# Patient Record
Sex: Female | Born: 1965 | Hispanic: No | Smoking: Never smoker
Health system: Southern US, Community
[De-identification: ages and names within clinical notes are randomized; demographics above are authoritative.]

## PROBLEM LIST (undated history)

## (undated) DIAGNOSIS — J302 Other seasonal allergic rhinitis: Secondary | ICD-10-CM

## (undated) DIAGNOSIS — Z30430 Encounter for insertion of intrauterine contraceptive device: Secondary | ICD-10-CM

## (undated) DIAGNOSIS — D649 Anemia, unspecified: Secondary | ICD-10-CM

## (undated) DIAGNOSIS — Z86010 Personal history of colonic polyps: Principal | ICD-10-CM

## (undated) DIAGNOSIS — F329 Major depressive disorder, single episode, unspecified: Secondary | ICD-10-CM

## (undated) DIAGNOSIS — E042 Nontoxic multinodular goiter: Secondary | ICD-10-CM

## (undated) DIAGNOSIS — T7840XA Allergy, unspecified, initial encounter: Secondary | ICD-10-CM

## (undated) DIAGNOSIS — E049 Nontoxic goiter, unspecified: Secondary | ICD-10-CM

## (undated) DIAGNOSIS — E781 Pure hyperglyceridemia: Secondary | ICD-10-CM

## (undated) HISTORY — DX: Nontoxic goiter, unspecified: E04.9

## (undated) HISTORY — DX: Nontoxic multinodular goiter: E04.2

## (undated) HISTORY — DX: Pure hyperglyceridemia: E78.1

## (undated) HISTORY — PX: WISDOM TOOTH EXTRACTION: SHX21

## (undated) HISTORY — DX: Personal history of colonic polyps: Z86.010

## (undated) HISTORY — DX: Encounter for insertion of intrauterine contraceptive device: Z30.430

## (undated) HISTORY — DX: Allergy, unspecified, initial encounter: T78.40XA

---

## 1986-01-27 HISTORY — PX: APPENDECTOMY: SHX54

## 1992-01-28 HISTORY — PX: COLPOSCOPY: SHX161

## 1997-12-07 ENCOUNTER — Other Ambulatory Visit: Admission: RE | Admit: 1997-12-07 | Discharge: 1997-12-07 | Payer: Self-pay | Admitting: *Deleted

## 1999-11-05 ENCOUNTER — Other Ambulatory Visit: Admission: RE | Admit: 1999-11-05 | Discharge: 1999-11-05 | Payer: Self-pay | Admitting: Obstetrics and Gynecology

## 2000-09-21 ENCOUNTER — Other Ambulatory Visit: Admission: RE | Admit: 2000-09-21 | Discharge: 2000-09-21 | Payer: Self-pay | Admitting: Obstetrics and Gynecology

## 2002-01-26 ENCOUNTER — Other Ambulatory Visit: Admission: RE | Admit: 2002-01-26 | Discharge: 2002-01-26 | Payer: Self-pay | Admitting: Obstetrics and Gynecology

## 2002-01-27 DIAGNOSIS — F32A Depression, unspecified: Secondary | ICD-10-CM

## 2002-01-27 HISTORY — DX: Depression, unspecified: F32.A

## 2003-02-17 ENCOUNTER — Other Ambulatory Visit: Admission: RE | Admit: 2003-02-17 | Discharge: 2003-02-17 | Payer: Self-pay | Admitting: Obstetrics and Gynecology

## 2004-03-08 ENCOUNTER — Other Ambulatory Visit: Admission: RE | Admit: 2004-03-08 | Discharge: 2004-03-08 | Payer: Self-pay | Admitting: Obstetrics and Gynecology

## 2005-03-11 ENCOUNTER — Other Ambulatory Visit: Admission: RE | Admit: 2005-03-11 | Discharge: 2005-03-11 | Payer: Self-pay | Admitting: Obstetrics and Gynecology

## 2006-05-01 ENCOUNTER — Other Ambulatory Visit: Admission: RE | Admit: 2006-05-01 | Discharge: 2006-05-01 | Payer: Self-pay | Admitting: Obstetrics and Gynecology

## 2007-07-14 ENCOUNTER — Other Ambulatory Visit: Admission: RE | Admit: 2007-07-14 | Discharge: 2007-07-14 | Payer: Self-pay | Admitting: Obstetrics and Gynecology

## 2008-08-04 DIAGNOSIS — Z30430 Encounter for insertion of intrauterine contraceptive device: Secondary | ICD-10-CM

## 2008-08-04 HISTORY — DX: Encounter for insertion of intrauterine contraceptive device: Z30.430

## 2009-08-08 ENCOUNTER — Encounter: Admission: RE | Admit: 2009-08-08 | Discharge: 2009-08-08 | Payer: Self-pay | Admitting: Obstetrics and Gynecology

## 2009-10-27 HISTORY — PX: OTHER SURGICAL HISTORY: SHX169

## 2009-10-31 ENCOUNTER — Other Ambulatory Visit: Admission: RE | Admit: 2009-10-31 | Discharge: 2009-10-31 | Payer: Self-pay | Admitting: Interventional Radiology

## 2009-10-31 ENCOUNTER — Encounter: Admission: RE | Admit: 2009-10-31 | Discharge: 2009-10-31 | Payer: Self-pay | Admitting: Endocrinology

## 2010-08-15 ENCOUNTER — Other Ambulatory Visit: Payer: Self-pay | Admitting: Endocrinology

## 2010-08-15 DIAGNOSIS — E049 Nontoxic goiter, unspecified: Secondary | ICD-10-CM

## 2010-08-26 ENCOUNTER — Ambulatory Visit
Admission: RE | Admit: 2010-08-26 | Discharge: 2010-08-26 | Disposition: A | Discharge status: Home / Self Care | Payer: BC Managed Care – PPO | Source: Ambulatory Visit | Attending: Endocrinology | Admitting: Endocrinology

## 2010-08-26 DIAGNOSIS — E049 Nontoxic goiter, unspecified: Secondary | ICD-10-CM

## 2010-09-06 ENCOUNTER — Encounter (INDEPENDENT_AMBULATORY_CARE_PROVIDER_SITE_OTHER): Payer: Self-pay | Admitting: General Surgery

## 2010-09-06 ENCOUNTER — Ambulatory Visit (INDEPENDENT_AMBULATORY_CARE_PROVIDER_SITE_OTHER): Payer: BC Managed Care – PPO | Admitting: General Surgery

## 2010-09-06 VITALS — BP 118/60 | HR 45 | Temp 97.6°F | Ht 67.0 in | Wt 151.0 lb

## 2010-09-06 DIAGNOSIS — N6019 Diffuse cystic mastopathy of unspecified breast: Secondary | ICD-10-CM

## 2010-09-06 NOTE — Patient Instructions (Signed)
PATIENT INSTRUCTIONS  FIBROCYSTIC BREAST DISEASE    FOLLOW-UP:  Please make an appointment with your physician in 6 month(s).  Call your physician should you develop a new breast mass that is different, if one particular lump starts to enlarge, or if nipple discharge develops.     CAUSE:  Many women have some lumpiness within their breasts and these areas at times can become tender during certain times in your menstrual cycle.  These areas tend to feel like a firm rubber ball as compared to a cancer which will more commonly feel hard and almost rock-like.  Fibrocystic breast disease does not in and of itself increase your risk for breast cancer but you should be sure to examine yiour breasts at the same time of the month on a monthly basis.  If there are a lot of areas of lumpiness you should tape a piece of paper on the mirror with a diagram of your breasts, noting where the areas of lumpiness are and their relative size.  You can then refer to this diagram on a monthly basis to keep better track of any changes should they occur.    DIET:  You should try and avoid foods, or at least minimize foods, such as chocolate and caffeine which may cause the symptoms of tenderness to become worse.    ACTIVITY:  You may want to wear a bra that offers additional support, and/or consider a sports bra, especially during those times when your breasts are more tender.    MEDICATIONS:  Taking Vitamin E capsules twice a day along with Evening of Primrose Capsules three times a day, or as directed on the bottle, may help your symptoms.  These are both available over-the-counter and without a prescription. There is clinical evidence that these may help symptoms in some patients.   If your physician has prescribed medication for your fibrocystic breast disease, be sure to take it as instructed on the bottle and let him/her know if you have any side effects.    QUESTIONS:  Please feel free to call your physician  if you have any  questions, and they will be glad to assist you.

## 2010-09-06 NOTE — Progress Notes (Signed)
Chelsea [name] is a 45 y.o. female.    Chief Complaint  Patient presents with  . Other    L breast lump    HPI HPI 45 year old Caucasian female referred by Dr. Meredeth Ide for a left breast palpable lesion. The patient noticed the area herself prior to May of this year. She mentioned it to her physician who monitored it for several months on physical exam. The patient believes that area has not significantly changed in size. It will occasionally enlarge around the time of her menstrual cycle. It only causes her some discomfort if she rotates in a certain way.  She denies any personal or family history of breast cancer. She denies any family history of colon, ovarian, pancreatic, prostate, or any other type of malignancy. Menarche was at age 63. She is a G4 P2. Age of first pregnancy was 45 years old. She has a Mirena IUD; however, she will still have menstrual cycles. She has never had a previous breast biopsy. She denies any breast skin changes or nipple discharge. She denies any lymphadenopathy or weight change.  She has been getting regular mammograms. She has had a diagnostic mammogram of her left breast and ultrasound most recently.  Past Medical History  Diagnosis Date  . Enlarged thyroid   . Multiple thyroid nodules     Past Surgical History  Procedure Date  . Colposcopy 1994  . Appendectomy     History reviewed. No pertinent family history.  Social History History  Substance Use Topics  . Smoking status: Never Smoker   . Smokeless tobacco: Not on file  . Alcohol Use: Yes    Allergies  Allergen Reactions  . Sulfur     Current Outpatient Prescriptions  Medication Sig Dispense Refill  . ibuprofen (ADVIL,MOTRIN) 200 MG tablet Take 200 mg by mouth every 6 (six) hours as needed.          Review of Systems Review of Systems  Constitutional: Negative for fever, chills, weight loss and diaphoresis.  HENT: Negative.   Eyes: Negative.   Respiratory: Negative.     Cardiovascular: Negative.   Gastrointestinal: Negative.   Genitourinary: Negative.   Musculoskeletal: Negative.   Neurological: Negative.  Negative for weakness.  Endo/Heme/Allergies: Negative.   Psychiatric/Behavioral: Negative.     Physical Exam Physical Exam  Constitutional: She is oriented to person, place, and time. She appears well-developed and well-nourished. No distress.  HENT:  Head: Normocephalic and atraumatic.  Eyes: Conjunctivae are normal. No scleral icterus.  Neck: Normal range of motion. Neck supple. No tracheal deviation present. No thyromegaly present.  Cardiovascular: Normal rate, regular rhythm and normal heart sounds.   Respiratory: Effort normal and breath sounds normal. No respiratory distress. She has no wheezes. Right breast exhibits no inverted nipple, no nipple discharge, no skin change and no tenderness. Left breast exhibits no inverted nipple, no nipple discharge, no skin change and no tenderness. Breasts are symmetrical.    GI: Soft. Bowel sounds are normal. There is no tenderness.  Musculoskeletal: Normal range of motion. She exhibits no edema.  Lymphadenopathy:    She has no cervical adenopathy.    She has no axillary adenopathy.  Neurological: She is alert and oriented to person, place, and time. She exhibits normal muscle tone.  Skin: Skin is warm and dry.  Psychiatric: She has a normal mood and affect. Her behavior is normal. Judgment and thought content normal.     Blood pressure 118/60, pulse 45, temperature 97.6 F (36.4 C),  height 5\' 7"  (1.702 m), weight 151 lb (68.493 kg).   Data reviewed: #1-August 13, 2010: Left breast ultrasound and diagnostic bilateral mammogram- the breasts are again noted to be involved to a moderate degree with heterogeneous fibroglandular structures. No abnormal masses identified and specifically in the medial left breast where the patient is questioning a lump and tenderness, no abnormality as noted. Left breast  ultrasound demonstrated primarily fatty breast tissue with a few interspersed glandular structures but nothing of significance. BI-RADS 2.  #2-March 13 2010: Right breast ultrasound-demonstrates no evidence of defined mass. There is a focal pattern of microcystic changes at 6:00 and 10:00. This pattern is stable when compared to the previous exam BI-RADS 2.  #3-November 15, 2009: I reviewed a right breast mammogram with ultrasound.  #4-August 06, 2009: Bilateral screening mammogram-scattered fibroglandular densities. The breast parenchymal pattern is stable with new new or worrisome finding in either breast.  I also reviewed the referring physicians medical notes.  Assessment/Plan 45 year old Caucasian female with a history of thyroid nodules with a left breast palpable lump consistent with fibrocystic change.  Her mammogram and ultrasound are reassuring. Her physical exam is also consistent with fibrocystic breast disease. We discussed fibrocystic breast disease. The patient was given educational material.  We discussed close observation versus surgical biopsy. I believe the area that the patient can palpate on physical exam is consistent with fibrocystic breast disease. My recommendation was to get a repeat ultrasound and unilateral mammogram in 6 months. I advised her to contact the office sooner should anything change on physical exam. I will see her in 6 months.  Chelsea Ina, MD  Chelsea [name] 09/06/2010, 8:34 PM

## 2011-03-10 ENCOUNTER — Encounter (INDEPENDENT_AMBULATORY_CARE_PROVIDER_SITE_OTHER): Payer: BC Managed Care – PPO | Admitting: General Surgery

## 2011-03-10 ENCOUNTER — Encounter (INDEPENDENT_AMBULATORY_CARE_PROVIDER_SITE_OTHER): Payer: Self-pay | Admitting: General Surgery

## 2011-03-10 ENCOUNTER — Ambulatory Visit (INDEPENDENT_AMBULATORY_CARE_PROVIDER_SITE_OTHER): Payer: BC Managed Care – PPO | Admitting: General Surgery

## 2011-03-10 VITALS — BP 118/76 | HR 68 | Temp 97.8°F | Resp 16 | Ht 67.0 in | Wt 160.2 lb

## 2011-03-10 DIAGNOSIS — N6019 Diffuse cystic mastopathy of unspecified breast: Secondary | ICD-10-CM

## 2011-03-10 NOTE — Patient Instructions (Signed)
Get your regular mammogram in July 2013

## 2011-03-10 NOTE — Progress Notes (Signed)
Subjective:     Patient ID: Chelsea [name], female   DOB: Nov 11, 1965, 46 y.o.   MRN: [id number]  HPI 45 year old Caucasian female comes in for followup for left breast palpable lump that she detected in July 2012. She had underwent an ultrasound of her left breast in July 2012 which was unremarkable. She comes in today for short followup. She denies any fever, chills, weight loss, nipple discharge, breast pain, skin changes, or lymphadenopathy. She denies any trips to the emergency room or hospital. She denies any new medical diagnoses or procedures. She denies any family diagnosis of cancer. She has not noticed the lump in her left breast for several months.  PMH, PSH, FAMH, SOCH, ALL, MEDs reviewed and unchanged.   Review of Systems 12 point ros NEGATIVE     Objective:   Physical Exam  BP 118/76  Pulse 68  Temp(Src) 97.8 F (36.6 C) (Temporal)  Resp 16  Ht 5\' 7"  (1.702 m)  Wt 160 lb 3.2 oz (72.666 kg)  BMI 25.09 kg/m2  Gen: alert, NAD, non-toxic appearing Pupils: equal, no scleral icterus Pulm: Lungs clear to auscultation, symmetric chest rise CV: regular rate and rhythm Abd: soft, nontender, nondistended.  Ext: no edema, MAE Skin: no rash, no jaundice Breast: symmetric, no nipple retraction, no skin changes. No palpable masses. No LAD (axillary or cervical)  Data Reviewed Right breast ultrasound 03/06/11 - Cluster of microcysts is redemonstrated at the 10:00 position and a smaller cluster of microcysts the 6:00 position. Stable-appearing compared to prior. There are no suspicious-looking masses, abnormal shadowing or other features suggestive for malignancy     Assessment:    46 year old Caucasian female with fibrocystic breast disease    Plan:     The patient was scheduled to have a left breast ultrasound to follow up on a palpable left breast lump. The ultrasound was ordered as her left breast ultrasound. However after conferring with the patient the patient confirms that  her right breast was ultrasounded at the most recent radiology appointment.  I spoke with Dr. Yolanda Bonine regarding this. She states that the patient will be contacted to schedule a left breast ultrasound for followup by her office.  Assuming her left breast ultrasound is normal, I will see her on an as-needed basis. If her ultrasound is abnormal then we will arrange followup.  Mary Sella. Andrey Campanile, MD, FACS General, Bariatric, & Minimally Invasive Surgery [facility], [place]

## 2011-03-11 ENCOUNTER — Telehealth (INDEPENDENT_AMBULATORY_CARE_PROVIDER_SITE_OTHER): Payer: Self-pay | Admitting: General Surgery

## 2011-03-11 NOTE — Telephone Encounter (Signed)
Patient called and left voicemail stating she spoke with Shanda Bumps @ [facility] re: this and did not need a call back.

## 2011-03-11 NOTE — Telephone Encounter (Signed)
Received a call from [place] at [facility] re: patient's left breast ultrasound. After reviewing records, it looks like patient had an issue with her left breast in the past but we were checking her right breast. The radiologist noted this and that is why a right breast ultrasound was performed. I tried to call patient to inform her of this, but phone went straight to voicemail. Left message for patient to call back.

## 2011-03-19 ENCOUNTER — Telehealth (INDEPENDENT_AMBULATORY_CARE_PROVIDER_SITE_OTHER): Payer: Self-pay | Admitting: General Surgery

## 2011-03-19 NOTE — Telephone Encounter (Signed)
Spoke with [facility], made them aware patient does need a left breast ultrasound per Dr Andrey Campanile due to area felt on physical exam by the patient and Dr Andrey Campanile. They will call patient and get this set up.

## 2011-03-27 ENCOUNTER — Encounter (INDEPENDENT_AMBULATORY_CARE_PROVIDER_SITE_OTHER): Payer: Self-pay

## 2011-03-28 ENCOUNTER — Encounter (INDEPENDENT_AMBULATORY_CARE_PROVIDER_SITE_OTHER): Payer: Self-pay

## 2011-05-22 ENCOUNTER — Encounter (INDEPENDENT_AMBULATORY_CARE_PROVIDER_SITE_OTHER): Payer: Self-pay

## 2011-05-29 ENCOUNTER — Other Ambulatory Visit: Payer: Self-pay | Admitting: Dermatology

## 2011-10-02 ENCOUNTER — Encounter (INDEPENDENT_AMBULATORY_CARE_PROVIDER_SITE_OTHER): Payer: Self-pay

## 2011-10-09 ENCOUNTER — Encounter (INDEPENDENT_AMBULATORY_CARE_PROVIDER_SITE_OTHER): Payer: Self-pay | Admitting: General Surgery

## 2011-10-09 ENCOUNTER — Ambulatory Visit (INDEPENDENT_AMBULATORY_CARE_PROVIDER_SITE_OTHER): Payer: BC Managed Care – PPO | Admitting: General Surgery

## 2011-10-09 VITALS — BP 120/82 | HR 62 | Temp 97.8°F | Ht 67.0 in | Wt 159.0 lb

## 2011-10-09 DIAGNOSIS — N6009 Solitary cyst of unspecified breast: Secondary | ICD-10-CM

## 2011-10-09 NOTE — Patient Instructions (Signed)
We will get a followup ultrasound to evaluate your right breast cysts

## 2011-10-10 NOTE — Progress Notes (Signed)
Subjective:     Patient ID: Chelsea [name], female   DOB: Feb 04, 1965, 46 y.o.   MRN: [id number]  HPI 46 year old Caucasian female comes in for followup for left breast palpable lump that she detected in July 2012. She had underwent an ultrasound of her left breast in July 2012 which was unremarkable. She comes in today for followup. I last saw her in the office in February 2013. At that time she was supposed to have a followup left breast ultrasound; however, the right breast was ultrasounded by error. She ultimately underwent left breast ultrasound in February 2013 which was normal. However the right breast ultrasound did reveal some microcyst at the 10:00 as well the 6:00 position. She denies any fever, chills, weight loss, nipple discharge, breast pain, skin changes, or lymphadenopathy. She denies any trips to the emergency room or hospital. She denies any new medical diagnoses or procedures. She denies any family diagnosis of cancer. She has not noticed the lump in her left breast for several months. She underwent her routine bilateral screening mammogram on 08/20/2011 which was normal  PMH, PSH, FAMH, SOCH, ALL, MEDs reviewed and unchanged.   Review of Systems 12 point ros NEGATIVE     Objective:   Physical Exam  BP 120/82  Pulse 62  Temp 97.8 F (36.6 C) (Temporal)  Ht 5\' 7"  (1.702 m)  Wt 159 lb (72.122 kg)  BMI 24.90 kg/m2  SpO2 98%  Gen: alert, NAD, non-toxic appearing Pupils: equal, no scleral icterus Pulm: Lungs clear to auscultation, symmetric chest rise CV: regular rate and rhythm Abd: soft, nontender, nondistended.  Ext: no edema, MAE Skin: no rash, no jaundice Breast: symmetric, no nipple retraction, no skin changes. No palpable masses. No LAD (axillary or cervical)  Data Reviewed B/l screening mammogram - Birads 1; heterogeneously dense; no significant change  Right breast ultrasound 03/06/11 - Cluster of microcysts is redemonstrated at the 10:00 position and a smaller  cluster of microcysts the 6:00 position. Stable-appearing compared to prior. There are no suspicious-looking masses, abnormal shadowing or other features suggestive for malignancy  Previous breast u/s     Assessment:    46 year old Caucasian female with fibrocystic breast disease    Plan:     We reviewed her prior ultrasounds as well as her mammogram. There is no suspicious findings on physical exam. However we did discuss the right breast ultrasound which revealed microcysts at the 10:00 as well as at the 6:00 position.  I spoke with Dr. Yolanda Bonine regarding this. We both agreed the patient should probably undergo a followup right breast ultrasound to evaluate the right breast cysts seen in February 2013.  Assuming her right breast ultrasound is normal-stable, I will see her on an as-needed basis.   Mary Sella. Andrey Campanile, MD, FACS General, Bariatric, & Minimally Invasive Surgery [facility], [place]

## 2011-11-06 ENCOUNTER — Encounter (INDEPENDENT_AMBULATORY_CARE_PROVIDER_SITE_OTHER): Payer: Self-pay

## 2012-04-07 ENCOUNTER — Other Ambulatory Visit: Payer: Self-pay | Admitting: Endocrinology

## 2012-04-07 DIAGNOSIS — E041 Nontoxic single thyroid nodule: Secondary | ICD-10-CM

## 2012-05-03 ENCOUNTER — Encounter (INDEPENDENT_AMBULATORY_CARE_PROVIDER_SITE_OTHER): Payer: Self-pay

## 2012-07-26 ENCOUNTER — Other Ambulatory Visit: Payer: BC Managed Care – PPO

## 2012-07-28 ENCOUNTER — Ambulatory Visit
Admission: RE | Admit: 2012-07-28 | Discharge: 2012-07-28 | Disposition: A | Discharge status: Home / Self Care | Payer: BC Managed Care – PPO | Source: Ambulatory Visit | Attending: Endocrinology | Admitting: Endocrinology

## 2012-07-28 DIAGNOSIS — E041 Nontoxic single thyroid nodule: Secondary | ICD-10-CM

## 2012-08-03 ENCOUNTER — Other Ambulatory Visit: Payer: Self-pay | Admitting: Endocrinology

## 2012-08-03 DIAGNOSIS — E041 Nontoxic single thyroid nodule: Secondary | ICD-10-CM

## 2012-08-17 ENCOUNTER — Ambulatory Visit
Admission: RE | Admit: 2012-08-17 | Discharge: 2012-08-17 | Disposition: A | Discharge status: Home / Self Care | Payer: BC Managed Care – PPO | Source: Ambulatory Visit | Attending: Endocrinology | Admitting: Endocrinology

## 2012-08-17 ENCOUNTER — Other Ambulatory Visit (HOSPITAL_COMMUNITY)
Admission: RE | Admit: 2012-08-17 | Discharge: 2012-08-17 | Disposition: A | Discharge status: Home / Self Care | Payer: BC Managed Care – PPO | Source: Ambulatory Visit | Attending: Interventional Radiology | Admitting: Interventional Radiology

## 2012-08-17 DIAGNOSIS — E041 Nontoxic single thyroid nodule: Secondary | ICD-10-CM

## 2012-08-17 DIAGNOSIS — E049 Nontoxic goiter, unspecified: Secondary | ICD-10-CM | POA: Insufficient documentation

## 2012-08-26 ENCOUNTER — Encounter: Payer: Self-pay | Admitting: Obstetrics and Gynecology

## 2012-08-27 ENCOUNTER — Encounter: Payer: Self-pay | Admitting: Obstetrics and Gynecology

## 2012-08-27 ENCOUNTER — Ambulatory Visit (INDEPENDENT_AMBULATORY_CARE_PROVIDER_SITE_OTHER): Payer: BC Managed Care – PPO | Admitting: Obstetrics and Gynecology

## 2012-08-27 VITALS — Ht 67.25 in | Wt 172.0 lb

## 2012-08-27 DIAGNOSIS — T8332XA Displacement of intrauterine contraceptive device, initial encounter: Secondary | ICD-10-CM

## 2012-08-27 DIAGNOSIS — Z01419 Encounter for gynecological examination (general) (routine) without abnormal findings: Secondary | ICD-10-CM

## 2012-08-27 DIAGNOSIS — Z0000 Encounter for general adult medical examination without abnormal findings: Secondary | ICD-10-CM

## 2012-08-27 DIAGNOSIS — T8339XA Other mechanical complication of intrauterine contraceptive device, initial encounter: Secondary | ICD-10-CM

## 2012-08-27 LAB — POCT URINALYSIS DIPSTICK: pH, UA: 5

## 2012-08-27 LAB — HEMOGLOBIN, FINGERSTICK: Hemoglobin, fingerstick: 13.8 g/dL (ref 12.0–16.0)

## 2012-08-27 NOTE — Progress Notes (Signed)
47 y.o.   Divorced    Caucasian   female   [id number]   here for annual exam.  Had bx of thyroid nodule by Dr. Horald Pollen and was benign.  Bx done because nodule was growing. Has only occ and light menses with her Mirena  No LMP recorded. Patient is not currently having periods (Reason: IUD).          Sexually active: no  The current method of family planning is IUD.    Exercising: walking qd Last mammogram:  08/20/11 ; 04/20/12 (ultrasound) Last pap smear: 08/06/09 History of abnormal pap: yes, ASCUS 1994 Smoking:  no Alcohol: 3-4 drinks/wk Last colonoscopy: no Last Bone Density:  nonr Last tetanus shot: 2008  Last cholesterol check: not sure   Hgb: 13.8            Urine: Negative   Family History  Problem Relation Age of Onset  . Hypertension Mother   . Hypertension Father     There are no active problems to display for this patient.   Past Medical History  Diagnosis Date  . Enlarged thyroid   . Multiple thyroid nodules   . Encounter for insertion of mirena IUD 08/04/08    Past Surgical History  Procedure Laterality Date  . Thyroid biopsy  10/11    hyplerplastic thyroid nodule- multinodular goiter  . Appendectomy  1988  . Colposcopy  1994    ASCUS    Allergies: Sulfur and Mupirocin  Current Outpatient Prescriptions  Medication Sig Dispense Refill  . DiphenhydrAMINE HCl (ALLERGY MED PO) Take by mouth.      Marland Kitchen ibuprofen (ADVIL,MOTRIN) 200 MG tablet Take 200 mg by mouth every 6 (six) hours as needed.        Marland Kitchen levonorgestrel (MIRENA) 20 MCG/24HR IUD 1 each by Intrauterine route once.       No current facility-administered medications for this visit.    ROS: Pertinent items are noted in HPI.  Social Hx:  Divorced, two children  Exam:    Ht 5' 7.25" (1.708 m)  Wt 172 lb (78.019 kg)  BMI 26.74 kg/m2  Ht stable, wt up 14 pounds Wt Readings from Last 3 Encounters:  08/27/12 172 lb (78.019 kg)  10/09/11 159 lb (72.122 kg)  03/10/11 160 lb 3.2 oz (72.666 kg)     Ht  Readings from Last 3 Encounters:  08/27/12 5' 7.25" (1.708 m)  10/09/11 5\' 7"  (1.702 m)  03/10/11 5\' 7"  (1.702 m)    General appearance: alert, cooperative and appears stated age Head: Normocephalic, without obvious abnormality, atraumatic Neck: no adenopathy, supple, symmetrical, trachea midline and thyroid not enlarged, symmetric, no tenderness/mass/nodules Lungs: clear to auscultation bilaterally Breasts: Inspection negative, No nipple retraction or dimpling, No nipple discharge or bleeding, No axillary or supraclavicular adenopathy, Normal to palpation without dominant masses Heart: regular rate and rhythm Abdomen: soft, non-tender; bowel sounds normal; no masses,  no organomegaly Extremities: extremities normal, atraumatic, no cyanosis or edema Skin: Skin color, texture, turgor normal. No rashes or lesions Lymph nodes: Cervical, supraclavicular, and axillary nodes normal. No abnormal inguinal nodes palpated Neurologic: Grossly normal   Pelvic: External genitalia:  no lesions              Urethra:  normal appearing urethra with no masses, tenderness or lesions              Bartholins and Skenes: normal  Vagina: normal appearing vagina with normal color and discharge, no lesions              Cervix: normal appearance but IUD strings not viz or palpable.  Tried to probe the canal with an IUD hook and with a cytobrush and no strings became visible.  Pt does not feel for them herself, so we have no way of knowing how long the strings have been missing.                Pap taken: yes        Bimanual Exam:  Uterus:  uterus is normal size, shape, consistency and nontender, RF, not especially mobile                                      Adnexa: normal adnexa in size, nontender and no masses                                      Rectovaginal: Confirms                                      Anus:  normal sphincter tone, no lesions  A: normal gyn exam, Mirena     miltinodular  thyroid with recent benign biopsy     Mirena placed July 2010 - strings not visible currently      P:     mammogram pap smear counseled on breast self exam, mammography screening, adequate intake of calcium and vitamin D, diet and exercise return annually or prn   Check FLP. Check PUS for IUD localization.     An After Visit Summary was printed and given to the patient.

## 2012-08-27 NOTE — Patient Instructions (Signed)
We will call you at the end of next week to schedule your ultrasound.  Your cholesterol result will be ready in about a week.   We will call you with the result.  If you will sign up for "my chart" our patient portal, the results will be available there.  EXERCISE AND DIET:  We recommended that you start or continue a regular exercise program for good health. Regular exercise means any activity that makes your heart beat faster and makes you sweat.  We recommend exercising at least 30 minutes per day at least 3 days a week, preferably 4 or 5.  We also recommend a diet low in fat and sugar.  Inactivity, poor dietary choices and obesity can cause diabetes, heart attack, stroke, and kidney damage, among others.    ALCOHOL AND SMOKING:  Women should limit their alcohol intake to no more than 7 drinks/beers/glasses of wine (combined, not each!) per week. Moderation of alcohol intake to this level decreases your risk of breast cancer and liver damage. And of course, no recreational drugs are part of a healthy lifestyle.  And absolutely no smoking or even second hand smoke. Most people know smoking can cause heart and lung diseases, but did you know it also contributes to weakening of your bones? Aging of your skin?  Yellowing of your teeth and nails?  CALCIUM AND VITAMIN D:  Adequate intake of calcium and Vitamin D are recommended.  The recommendations for exact amounts of these supplements seem to change often, but generally speaking 600 mg of calcium (either carbonate or citrate) and 800 units of Vitamin D per day seems prudent. Certain women may benefit from higher intake of Vitamin D.  If you are among these women, your doctor will have told you during your visit.    PAP SMEARS:  Pap smears, to check for cervical cancer or precancers,  have traditionally been done yearly, although recent scientific advances have shown that most women can have pap smears less often.  However, every woman still should have a  physical exam from her gynecologist every year. It will include a breast check, inspection of the vulva and vagina to check for abnormal growths or skin changes, a visual exam of the cervix, and then an exam to evaluate the size and shape of the uterus and ovaries.  And after 47 years of age, a rectal exam is indicated to check for rectal cancers. We will also provide age appropriate advice regarding health maintenance, like when you should have certain vaccines, screening for sexually transmitted diseases, bone density testing, colonoscopy, mammograms, etc.   MAMMOGRAMS:  All women over 16 years old should have a yearly mammogram. Many facilities now offer a "3D" mammogram, which may cost around $50 extra out of pocket. If possible,  we recommend you accept the option to have the 3D mammogram performed.  It both reduces the number of women who will be called back for extra views which then turn out to be normal, and it is better than the routine mammogram at detecting truly abnormal areas.    COLONOSCOPY:  Colonoscopy to screen for colon cancer is recommended for all women at age 67.  We know, you hate the idea of the prep.  We agree, BUT, having colon cancer and not knowing it is worse!!  Colon cancer so often starts as a polyp that can be seen and removed at colonscopy, which can quite literally save your life!  And if your first colonoscopy is  normal and you have no family history of colon cancer, most women don't have to have it again for 10 years.  Once every ten years, you can do something that may end up saving your life, right?  We will be happy to help you get it scheduled when you are ready.  Be sure to check your insurance coverage so you understand how much it will cost.  It may be covered as a preventative service at no cost, but you should check your particular policy.

## 2012-08-28 LAB — LIPID PANEL
HDL: 54 mg/dL (ref >39)
LDL Cholesterol: 81 mg/dL (ref 0–99)
Total CHOL/HDL Ratio: 3.2 Ratio
VLDL: 39 mg/dL (ref 0–40)

## 2012-08-30 NOTE — Addendum Note (Signed)
Addended by: Alison Murray on: 08/30/2012 02:56 PM   Modules accepted: Orders

## 2012-08-31 LAB — IPS PAP TEST WITH HPV

## 2012-09-01 ENCOUNTER — Telehealth: Payer: Self-pay | Admitting: Obstetrics and Gynecology

## 2012-09-01 NOTE — Telephone Encounter (Signed)
Patient is returning Carol's call for lab results. Okay to leave detailed message.

## 2012-09-01 NOTE — Telephone Encounter (Signed)
Spoke with patient about her lab results cm

## 2012-09-21 ENCOUNTER — Ambulatory Visit (INDEPENDENT_AMBULATORY_CARE_PROVIDER_SITE_OTHER): Payer: BC Managed Care – PPO

## 2012-09-21 ENCOUNTER — Encounter: Payer: Self-pay | Admitting: Obstetrics and Gynecology

## 2012-09-21 ENCOUNTER — Ambulatory Visit (INDEPENDENT_AMBULATORY_CARE_PROVIDER_SITE_OTHER): Payer: BC Managed Care – PPO | Admitting: Obstetrics and Gynecology

## 2012-09-21 DIAGNOSIS — T8332XA Displacement of intrauterine contraceptive device, initial encounter: Secondary | ICD-10-CM

## 2012-09-21 DIAGNOSIS — T8339XA Other mechanical complication of intrauterine contraceptive device, initial encounter: Secondary | ICD-10-CM

## 2012-09-21 NOTE — Patient Instructions (Signed)
Continue with you routine care.  The IUD is in good position.

## 2012-09-21 NOTE — Progress Notes (Signed)
47 yo DWF G3P2 with Mirena, strings unable to be viz'd at [facility].  Here for IUD localization.  Pus shows:    Discussed results with pt.  IUD in correct position in the uterus.  One 3 cm fibroid, asymptomatic.  Pt advised no intervention necessary at this point.  Continue routine f/u and IUD removal in July 2015.

## 2012-12-09 ENCOUNTER — Encounter (INDEPENDENT_AMBULATORY_CARE_PROVIDER_SITE_OTHER): Payer: Self-pay

## 2013-01-03 ENCOUNTER — Encounter (INDEPENDENT_AMBULATORY_CARE_PROVIDER_SITE_OTHER): Payer: Self-pay

## 2013-01-05 ENCOUNTER — Encounter: Payer: Self-pay | Admitting: Obstetrics & Gynecology

## 2013-08-29 ENCOUNTER — Ambulatory Visit: Payer: BC Managed Care – PPO | Admitting: Obstetrics and Gynecology

## 2013-08-29 ENCOUNTER — Encounter: Payer: Self-pay | Admitting: Obstetrics and Gynecology

## 2013-08-29 ENCOUNTER — Ambulatory Visit (INDEPENDENT_AMBULATORY_CARE_PROVIDER_SITE_OTHER): Payer: BC Managed Care – PPO | Admitting: Obstetrics and Gynecology

## 2013-08-29 VITALS — BP 110/68 | HR 70 | Resp 14 | Ht 67.0 in | Wt 172.4 lb

## 2013-08-29 DIAGNOSIS — Z3009 Encounter for other general counseling and advice on contraception: Secondary | ICD-10-CM

## 2013-08-29 DIAGNOSIS — Z0000 Encounter for general adult medical examination without abnormal findings: Secondary | ICD-10-CM

## 2013-08-29 DIAGNOSIS — Z01419 Encounter for gynecological examination (general) (routine) without abnormal findings: Secondary | ICD-10-CM

## 2013-08-29 DIAGNOSIS — R0789 Other chest pain: Secondary | ICD-10-CM

## 2013-08-29 LAB — POCT URINALYSIS DIPSTICK
BILIRUBIN UA: NEGATIVE
Blood, UA: NEGATIVE
Glucose, UA: NEGATIVE
KETONES UA: NEGATIVE
LEUKOCYTES UA: NEGATIVE
Nitrite, UA: NEGATIVE
Protein, UA: NEGATIVE
Urobilinogen, UA: NEGATIVE
pH, UA: 5

## 2013-08-29 LAB — COMPREHENSIVE METABOLIC PANEL
ALT: 33 U/L (ref 0–35)
AST: 30 U/L (ref 0–37)
Albumin: 4.3 g/dL (ref 3.5–5.2)
Alkaline Phosphatase: 41 U/L (ref 39–117)
BILIRUBIN TOTAL: 0.5 mg/dL (ref 0.2–1.2)
BUN: 12 mg/dL (ref 6–23)
CO2: 25 meq/L (ref 19–32)
CREATININE: 0.83 mg/dL (ref 0.50–1.10)
Calcium: 9.1 mg/dL (ref 8.4–10.5)
Chloride: 99 mEq/L (ref 96–112)
GLUCOSE: 81 mg/dL (ref 70–99)
Potassium: 4.8 mEq/L (ref 3.5–5.3)
SODIUM: 135 meq/L (ref 135–145)
TOTAL PROTEIN: 6.8 g/dL (ref 6.0–8.3)

## 2013-08-29 LAB — LIPID PANEL
CHOLESTEROL: 180 mg/dL (ref 0–200)
HDL: 55 mg/dL (ref >39)
LDL Cholesterol: 94 mg/dL (ref 0–99)
TRIGLYCERIDES: 155 mg/dL — AB (ref <150)
Total CHOL/HDL Ratio: 3.3 Ratio
VLDL: 31 mg/dL (ref 0–40)

## 2013-08-29 LAB — CBC
HCT: 40.2 % (ref 36.0–46.0)
HEMOGLOBIN: 14 g/dL (ref 12.0–15.0)
MCH: 31.7 pg (ref 26.0–34.0)
MCHC: 34.8 g/dL (ref 30.0–36.0)
MCV: 91.2 fL (ref 78.0–100.0)
PLATELETS: 328 10*3/uL (ref 150–400)
RBC: 4.41 MIL/uL (ref 3.87–5.11)
RDW: 12.3 % (ref 11.5–15.5)
WBC: 6.2 10*3/uL (ref 4.0–10.5)

## 2013-08-29 NOTE — Progress Notes (Signed)
Patient ID: Chelsea [name], female   DOB: 1965-03-20, 48 y.o.   MRN: [id number] GYNECOLOGY VISIT  PCP:   Kathyrn Lass, MD Endocrinology:  Dr. Soyla Murphy Referring provider:   HPI: 48 y.o.   Divorced  Caucasian  female   [phone] with No LMP recorded. Patient is not currently having periods (Reason: IUD).   here for   AEX. Mirena IUD placed 08/04/08. Likes the IUD.  Was having heavy bleeding prior to this IUD being placed.  Had an ultrasound last year to localize the IUD in endometrial canal.   Feeling some weight in her chest since June 2015.  No cough.  No chest pain. No radiation of anything down left arm or into jaw.  Has allergies.  Stress due to daughter's wedding in June.  Recent travel to [place].   [place] and hurt her back in August 2015.  Had an MRI and this was normal.   Hgb:    13.9 Urine:  neg  GYNECOLOGIC HISTORY: No LMP recorded. Patient is not currently having periods (Reason: IUD). Sexually active:  yes Partner preference: female Contraception:  Mirena IUD--inserted 07/2008, vasectomy.  Menopausal hormone therapy: n/a DES exposure:  no  Blood transfusions:  no  Sexually transmitted diseases:  no GYN procedures and prior surgeries:  Colposcopy and cryotherapy to cervix 1994. Last mammogram:  11-30-12 VEL:FYBOF.  Patient did follow up of right breast and has benign cysts.  Due for routine follow up in November 2015.            Last pap and high risk HPV testing:  08-30-12 wnl:neg HR HPV  History of abnormal pap smear:  Ascus in 1994 with colposcopy and cryotherapy to cervix.   OB History   Grav Para Term Preterm Abortions TAB SAB Ect Mult Living   3 2 2  1     2        LIFESTYLE: Exercise: walking          Tobacco:  no Alcohol:    no Drug use:  no  OTHER HEALTH MAINTENANCE: Tetanus/TDap:  2008 Gardisil:             n/a Influenza:          10/2012 Zostavax:         n/a  Bone density:   n/a Colonoscopy:    n/a  Cholesterol check: normal 2014  Family  History  Problem Relation Age of Onset  . Hypertension Mother   . Hypertension Father     There are no active problems to display for this patient.  Past Medical History  Diagnosis Date  . Enlarged thyroid   . Multiple thyroid nodules   . Encounter for insertion of mirena IUD 08/04/08    Past Surgical History  Procedure Laterality Date  . Thyroid biopsy  10/11    hyplerplastic thyroid nodule- multinodular goiter  . Appendectomy  1988  . Colposcopy  1994    ASCUS    ALLERGIES: Sulfur and Mupirocin  Current Outpatient Prescriptions  Medication Sig Dispense Refill  . levonorgestrel (MIRENA) 20 MCG/24HR IUD 1 each by Intrauterine route once.      . DiphenhydrAMINE HCl (ALLERGY MED PO) Take by mouth.      Marland Kitchen ibuprofen (ADVIL,MOTRIN) 200 MG tablet Take 200 mg by mouth every 6 (six) hours as needed.         No current facility-administered medications for this visit.     ROS:  Pertinent items are noted in HPI.  SOCIAL HISTORY:  Married.   PHYSICAL EXAMINATION:    BP 110/68  Pulse 70  Resp 14  Ht 5\' 7"  (1.702 m)  Wt 172 lb 6.4 oz (78.2 kg)  BMI 27.00 kg/m2   Wt Readings from Last 3 Encounters:  08/29/13 172 lb 6.4 oz (78.2 kg)  08/27/12 172 lb (78.019 kg)  10/09/11 159 lb (72.122 kg)     Ht Readings from Last 3 Encounters:  08/29/13 5\' 7"  (1.702 m)  08/27/12 5' 7.25" (1.708 m)  10/09/11 5\' 7"  (1.702 m)    General appearance: alert, cooperative and appears stated age.  No acute distress.  Head: Normocephalic, without obvious abnormality, atraumatic Neck: no adenopathy, supple, symmetrical, trachea midline and thyroid not enlarged, symmetric, no tenderness/mass/nodules Lungs: clear to auscultation bilaterally Breasts: Inspection negative, No nipple retraction or dimpling, No nipple discharge or bleeding, No axillary or supraclavicular adenopathy, Normal to palpation without dominant masses Heart: regular rate and rhythm Abdomen: soft, non-tender; no masses,  no  organomegaly Extremities: extremities normal, atraumatic, no cyanosis or edema Skin: Skin color, texture, turgor normal. No rashes or lesions Lymph nodes: Cervical, supraclavicular, and axillary nodes normal. No abnormal inguinal nodes palpated Neurologic: Grossly normal  Pelvic: External genitalia:  no lesions              Urethra:  normal appearing urethra with no masses, tenderness or lesions              Bartholins and Skenes: normal                 Vagina: normal appearing vagina with normal color and discharge, no lesions              Cervix: normal appearance.  IUD strings not visible.               Pap and high risk HPV testing done: No.         Bimanual Exam:  Uterus:  uterus is normal size, shape, consistency and nontender                                      Adnexa: normal adnexa in size, nontender and no masses                                      Rectovaginal: Confirms                                      Anus:  normal sphincter tone, no lesions  ASSESSMENT  Normal gynecologic exam. Expired IUD.  History of cryotherapy to cervix.  Chest heaviness.    PLAN  Mammogram recommended yearly.  Pap smear and high risk HPV testing not indicated.  Use back up protection.  (Partner has vasectomy. ) Plan for IUD removal and insertion of new Mirena IUD.  Counseled on self breast exam, Calcium and vitamin D intake, exercise. Lipid profile, CMP, CBC. I recommend that patient follow up with her PCP regarding her chest symptoms.  Return annually or prn   An After Visit Summary was printed and given to the patient.

## 2013-08-29 NOTE — Patient Instructions (Signed)

## 2013-08-30 DIAGNOSIS — R0789 Other chest pain: Secondary | ICD-10-CM | POA: Insufficient documentation

## 2013-08-30 LAB — HEMOGLOBIN, FINGERSTICK: HEMOGLOBIN, FINGERSTICK: 13.9 g/dL (ref 12.0–16.0)

## 2013-09-12 ENCOUNTER — Telehealth: Payer: Self-pay | Admitting: Obstetrics and Gynecology

## 2013-09-12 DIAGNOSIS — Z0189 Encounter for other specified special examinations: Secondary | ICD-10-CM

## 2013-09-12 NOTE — Telephone Encounter (Signed)
Patient called back. Spoke with patient. Advised that per benefits quote received, IUD, removal, and insertion is covered at 100%. There will be 0 patient liability. Patient is to call within the first 5 days of her cycle to schedule insertion.  Patient doen't have cycle. Needs to schedule removal and re-insertion. Passed call to [facility] for scheduling.

## 2013-09-12 NOTE — Telephone Encounter (Signed)
Spoke with patient. Advised of message as seen below from [place]. Patient is agreeable and verbalizes understanding. 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 Lab appointment scheduled for 9/1 at 4:10pm so that we will have enough time to get results back before appointment for insertion on 9/3. Patient agreeable to date and time. See previous telephone encounter regarding scheduling of iud removal and reinsertion as it was previously closed.   Brook E Amundson de Berton Lan, MD at 09/12/2013 11:56 AM     Status: Signed        Please inform patient that her IUD is expired.  Was place in July 2010.  Patient needs to use back up contraception for any intercourse.  Will need a blood pregnancy test, quantitative beta hCG the day prior to the IUD exchange and will need to abstain until IUD placed and will need to wait one month for pregnancy protection after it is placed.    Routing to provider for final review. Patient agreeable to disposition. Will close encounter

## 2013-09-12 NOTE — Addendum Note (Signed)
Addended by: Jasmine Awe on: 09/12/2013 04:13 PM   Modules accepted: Orders

## 2013-09-12 NOTE — Telephone Encounter (Signed)
Patient is returning a call to Sabrina °

## 2013-09-12 NOTE — Telephone Encounter (Signed)
Please inform patient that her IUD is expired.  Was place in July 2010.  Patient needs to use back up contraception for any intercourse.  Will need a blood pregnancy test, quantitative beta hCG the day prior to the IUD exchange and will need to abstain until IUD placed and will need to wait one month for pregnancy protection after it is placed.

## 2013-09-12 NOTE — Telephone Encounter (Signed)
Left message for patient to call back. Need to go over contraception benefits. °

## 2013-09-12 NOTE — Telephone Encounter (Signed)
Spoke with patient. Advised of message as seen below from [place]. Patient is agreeable and verbalizes understanding. 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 Lab appointment scheduled for 9/1 at 4:10pm so that we will have enough time to get results back before appointment for insertion on 9/3. Patient agreeable to date and time.   Routing to provider for final review. Patient agreeable to disposition. Will close encounter

## 2013-09-12 NOTE — Telephone Encounter (Signed)
Spoke with patient. Patient would like to scheduled IUD removal and reinsertion a this time. Appointment scheduled for September 3rd at 3pm with Dr.Silva. Patient agreeable to date and time. Pre procedure instructions given.  Motrin instructions given. Motrin=Advil=Ibuprofen, 800 mg one hour before appointment. Eat a meal and hydrate well before appointment. Patient agreeable. Patient will call 72 before if needs to reschedule.   Routing to provider for final review. Patient agreeable to disposition. Will close encounter

## 2013-09-12 NOTE — Telephone Encounter (Signed)
Spoke with patient. Patient states that now is not a good time to talk and that she will call back.

## 2013-09-27 ENCOUNTER — Other Ambulatory Visit (INDEPENDENT_AMBULATORY_CARE_PROVIDER_SITE_OTHER): Payer: BC Managed Care – PPO

## 2013-09-27 DIAGNOSIS — Z0000 Encounter for general adult medical examination without abnormal findings: Secondary | ICD-10-CM

## 2013-09-27 DIAGNOSIS — Z0189 Encounter for other specified special examinations: Secondary | ICD-10-CM

## 2013-09-28 LAB — HCG, QUANTITATIVE, PREGNANCY

## 2013-09-29 ENCOUNTER — Ambulatory Visit (INDEPENDENT_AMBULATORY_CARE_PROVIDER_SITE_OTHER): Payer: BC Managed Care – PPO | Admitting: Obstetrics and Gynecology

## 2013-09-29 ENCOUNTER — Ambulatory Visit (INDEPENDENT_AMBULATORY_CARE_PROVIDER_SITE_OTHER): Payer: BC Managed Care – PPO

## 2013-09-29 ENCOUNTER — Encounter: Payer: Self-pay | Admitting: Obstetrics and Gynecology

## 2013-09-29 VITALS — BP 110/76 | HR 64 | Resp 16 | Ht 67.0 in | Wt 167.8 lb

## 2013-09-29 DIAGNOSIS — T8339XA Other mechanical complication of intrauterine contraceptive device, initial encounter: Secondary | ICD-10-CM

## 2013-09-29 DIAGNOSIS — Z3009 Encounter for other general counseling and advice on contraception: Secondary | ICD-10-CM

## 2013-09-29 DIAGNOSIS — Z538 Procedure and treatment not carried out for other reasons: Secondary | ICD-10-CM

## 2013-09-29 DIAGNOSIS — T8332XA Displacement of intrauterine contraceptive device, initial encounter: Secondary | ICD-10-CM

## 2013-09-29 DIAGNOSIS — Z30432 Encounter for removal of intrauterine contraceptive device: Secondary | ICD-10-CM

## 2013-09-29 DIAGNOSIS — Z975 Presence of (intrauterine) contraceptive device: Secondary | ICD-10-CM

## 2013-09-29 MED ORDER — DOXYCYCLINE HYCLATE 100 MG PO CAPS: 100.0000 mg | ORAL_CAPSULE | Freq: Two times a day (BID) | ORAL | Status: DC

## 2013-09-29 NOTE — Progress Notes (Signed)
Ultrasound - fundal fibroid 3.4 x 2.1 cm.  IUD strings 2 cm from external os. Ovaries normal. No free fluid.

## 2013-09-29 NOTE — Progress Notes (Signed)
Patient ID: Chelsea [name], female   DOB: 1965-06-28, 48 y.o.   MRN: [id number] GYNECOLOGY  VISIT   HPI: 48 y.o.   Divorced  Caucasian  female   [phone] with No LMP recorded. Patient is not currently having periods (Reason: IUD).   here for Mirena IUD removal and reinsertion.   Patient states she did take 800mg  of Ibuprofen prior to office visit. Quant. HCG Neg: 09-27-13 Partner has had vasectomy.   Like MIrena for tx of heavy menses.   GYNECOLOGIC HISTORY: No LMP recorded. Patient is not currently having periods (Reason: IUD). Contraception: Mirena IUD--placed 08-04-08.  Menopausal hormone therapy: n/a        OB History   Grav Para Term Preterm Abortions TAB SAB Ect Mult Living   3 2 2  1     2          Patient Active Problem List   Diagnosis Date Noted  . Chest heaviness 08/30/2013    Past Medical History  Diagnosis Date  . Enlarged thyroid   . Multiple thyroid nodules   . Encounter for insertion of mirena IUD 08/04/08    Past Surgical History  Procedure Laterality Date  . Thyroid biopsy  10/11    hyplerplastic thyroid nodule- multinodular goiter  . Appendectomy  1988  . Colposcopy  1994    ASCUS    Current Outpatient Prescriptions  Medication Sig Dispense Refill  . cetirizine (ZYRTEC) 10 MG tablet Take 10 mg by mouth daily.      Marland Kitchen ibuprofen (ADVIL,MOTRIN) 200 MG tablet Take 200 mg by mouth every 6 (six) hours as needed.        Marland Kitchen levonorgestrel (MIRENA) 20 MCG/24HR IUD 1 each by Intrauterine route once.       No current facility-administered medications for this visit.     ALLERGIES: Sulfur and Mupirocin  Family History  Problem Relation Age of Onset  . Hypertension Mother   . Hypertension Father     History   Social History  . Marital Status: Divorced    Spouse Name: N/A    Number of Children: N/A  . Years of Education: N/A   Occupational History  . Not on file.   Social History Main Topics  . Smoking status: Never Smoker   . Smokeless tobacco:  Never Used  . Alcohol Use: 2.0 oz/week    4 drink(s) per week  . Drug Use: No  . Sexual Activity: Yes    Partners: Male    Birth Control/ Protection: IUD     Comment: Mirena IUD--placed 08-04-08   Other Topics Concern  . Not on file   Social History Narrative  . No narrative on file    ROS:  Pertinent items are noted in HPI.  PHYSICAL EXAMINATION:    BP 110/76  Pulse 64  Resp 16  Ht 5\' 7"  (1.702 m)  Wt 167 lb 12.8 oz (76.114 kg)  BMI 26.28 kg/m2     General appearance: alert, cooperative and appears stated age  Pelvic: External genitalia:  no lesions              Urethra:  normal appearing urethra with no masses, tenderness or lesions              Bartholins and Skenes: normal                 Vagina: normal appearing vagina with normal color and discharge, no lesions  Cervix: normal appearance.  IUD string not seen.                    Bimanual Exam:  Uterus:  uterus is normal size, shape, consistency and nontender, retroverted.                                       Adnexa: normal adnexa in size, nontender and no masses   IUD removal  Consent for procedure.  Speculum placed. Sterile prep of cervix with Hibiclens.  Tenaculum to anterior cervical lip.  Dressing forceps and IUD hook used to attempt to reach strings - unsuccessful.  Reprep of cervix with Hibiclens. Paracervical block with 10 cc 1% lidocaine - lot #[id number], expiration 06/28/14. Continued attempts to remove IUD unsuccessful.  Transabdominal ultrasound assistance obtained in office. Speculum replaced and prep with Hibiclens.  Attempt at removal unsuccessful with same instruments.  Patient tolerated procedure well with no complications.  Minimal EBL.                                      ASSESSMENT  Retained IUD. Lost strings not reachable.  Retroverted uterus.  Fundal fibroid.  History of menorrhagia.  Desire for new Mirena IUD.   PLAN  Will need to pursue IUD removal with hysteroscopic  guidance in the OR. This discussed with patient.  New Mirena IUD to be placed as well, hopefully in the OR at the same time.  Return for preop visit.    An After Visit Summary was printed and given to the patient.  __25____ minutes face to face time of which over 50% was spent in counseling.

## 2013-10-07 ENCOUNTER — Telehealth: Payer: Self-pay | Admitting: Obstetrics and Gynecology

## 2013-10-07 NOTE — Telephone Encounter (Signed)
Pt is calling Chelsea [name] back said to call back after 2:30

## 2013-10-07 NOTE — Telephone Encounter (Signed)
Left message for patient to call back. Need to go over surgical benefits.

## 2013-10-07 NOTE — Telephone Encounter (Signed)
Pt is calling sabrina back

## 2013-10-10 ENCOUNTER — Other Ambulatory Visit: Payer: Self-pay | Admitting: Endocrinology

## 2013-10-10 DIAGNOSIS — E041 Nontoxic single thyroid nodule: Secondary | ICD-10-CM

## 2013-10-10 NOTE — Telephone Encounter (Signed)
Return call from patient. Patient quite surprised by financial cost of removal if IUD at hospital. This is very unexpected and she states she cannot afford. She has not met deductible. Requesting to see if Dr Quincy Simmonds would be willing to retry in office, particularly since covered in office. Patient thinking maybe a different day would yeild better results. Advised I do not see anything in office note that would indicate something different we could try for office procedure. Also advised that there are risks involved with attempting again and we dont want to make this be an emergent procedure as a result of second attempt. Advised I can see if removing the reinsertion would decrease cost. Patient states she is uncertain about reinsertion as she doesn't want to have this happen again. Also mentioned checking to see if could wait till first of year so can plan to alter flex spending account, Patient states this would still be hard for her financially.   I have had Sabrina recheck cost with hysteroscopic removal of IUD only, decreased cost to $680.91 (from approximately $900). Has $700 deductible and has not met any of this.  Please advise.

## 2013-10-10 NOTE — Telephone Encounter (Signed)
Call to patient, LMTCB

## 2013-10-10 NOTE — Telephone Encounter (Signed)
Spoke with patient. Advised that per benefit quote received, she will be responsible for $883.97 for the surgeons portion of her surgery. Advised that payment is to be paid in full at least 2 weeks prior to the scheduled surgery date. Patient expresses understanding and would like Gay Filler to call her regarding scheduling.

## 2013-10-11 NOTE — Telephone Encounter (Signed)
If the office is able to obtain a special instrument for retrieval of the IUD in the office, I am willing to try again.  Otherwise, patient will need removal in the OR setting.

## 2013-10-11 NOTE — Telephone Encounter (Signed)
Call to patient to update. Attempting to order additional instrument. LMTCB.

## 2013-10-12 NOTE — Telephone Encounter (Signed)
Returning a call to [place]. Pt states she will call you tomorrow around 11am.

## 2013-10-13 NOTE — Telephone Encounter (Signed)
Patient returned call. Advised that Dr Quincy Simmonds will attempt removal with new instrument. I will call her as soon as it is received. Patient agreeable.

## 2013-10-14 ENCOUNTER — Ambulatory Visit
Admission: RE | Admit: 2013-10-14 | Discharge: 2013-10-14 | Disposition: A | Discharge status: Home / Self Care | Payer: BC Managed Care – PPO | Source: Ambulatory Visit | Attending: Endocrinology | Admitting: Endocrinology

## 2013-10-14 DIAGNOSIS — E041 Nontoxic single thyroid nodule: Secondary | ICD-10-CM

## 2013-10-27 ENCOUNTER — Telehealth: Payer: Self-pay | Admitting: Obstetrics and Gynecology

## 2013-10-27 DIAGNOSIS — Z30432 Encounter for removal of intrauterine contraceptive device: Secondary | ICD-10-CM

## 2013-10-27 NOTE — Telephone Encounter (Signed)
Left message for patient to call back. Need to go over benefits and would like to schedule PUS guided IUD removal for 10.08.2015. Pr $70.00

## 2013-11-03 NOTE — Telephone Encounter (Signed)
Left message for patient to call back  

## 2013-11-03 NOTE — Telephone Encounter (Signed)
Pt is calling sabrina back

## 2013-11-04 NOTE — Telephone Encounter (Signed)
Spoke with patient. Advised that per benefit quote received, she will be responsible to pay a $70 copay when she comes in for ultrasound guided IUD removal. Patient agreeable. Scheduled procedure. Advised patient of 72 hour cancellation policy and $863 cancellation fee. Patient agreeable.

## 2013-11-04 NOTE — Telephone Encounter (Signed)
Ultrasound order placed. Patient is aware.

## 2013-11-14 ENCOUNTER — Telehealth: Payer: Self-pay | Admitting: Obstetrics and Gynecology

## 2013-11-14 NOTE — Telephone Encounter (Signed)
Patient called to reschedule PUS. Rescheduled for 10.29.2015

## 2013-11-17 ENCOUNTER — Other Ambulatory Visit: Payer: BC Managed Care – PPO

## 2013-11-17 ENCOUNTER — Other Ambulatory Visit: Payer: BC Managed Care – PPO | Admitting: Obstetrics and Gynecology

## 2013-11-23 ENCOUNTER — Telehealth: Payer: Self-pay | Admitting: Obstetrics and Gynecology

## 2013-11-23 NOTE — Telephone Encounter (Signed)
Left message for patient to call back. Advised that appt for tomorrow has been cancelled, please call back to reschedule.

## 2013-11-24 ENCOUNTER — Ambulatory Visit (INDEPENDENT_AMBULATORY_CARE_PROVIDER_SITE_OTHER): Payer: BC Managed Care – PPO

## 2013-11-24 ENCOUNTER — Encounter: Payer: Self-pay | Admitting: Obstetrics and Gynecology

## 2013-11-24 ENCOUNTER — Ambulatory Visit (INDEPENDENT_AMBULATORY_CARE_PROVIDER_SITE_OTHER): Payer: BC Managed Care – PPO | Admitting: Obstetrics and Gynecology

## 2013-11-24 ENCOUNTER — Other Ambulatory Visit: Payer: BC Managed Care – PPO

## 2013-11-24 ENCOUNTER — Other Ambulatory Visit: Payer: BC Managed Care – PPO | Admitting: Obstetrics and Gynecology

## 2013-11-24 ENCOUNTER — Other Ambulatory Visit: Payer: Self-pay | Admitting: Obstetrics and Gynecology

## 2013-11-24 VITALS — BP 110/64 | HR 60 | Ht 67.0 in | Wt 168.6 lb

## 2013-11-24 DIAGNOSIS — Z30432 Encounter for removal of intrauterine contraceptive device: Secondary | ICD-10-CM

## 2013-11-24 DIAGNOSIS — T8332XD Displacement of intrauterine contraceptive device, subsequent encounter: Secondary | ICD-10-CM

## 2013-11-24 DIAGNOSIS — T8389XD Other specified complication of genitourinary prosthetic devices, implants and grafts, subsequent encounter: Secondary | ICD-10-CM

## 2013-11-24 MED ORDER — DOXYCYCLINE HYCLATE 100 MG PO CAPS
100.0000 mg | ORAL_CAPSULE | Freq: Two times a day (BID) | ORAL | Status: DC
Start: 2013-11-24 — End: 2014-06-01

## 2013-11-24 NOTE — Patient Instructions (Signed)
Please take the Doxycycline antibiotic tonight and then again in 12 hours. We will call with the precertification information.  Call for any fever or significant pain.

## 2013-11-24 NOTE — Progress Notes (Signed)
Subjective  Patient is here for IUD removal under ultrasound guidance and insertion of new Mirena IUD. Mirena IUD placed on 08/04/08.  Had attempt for removal of IUD in office which was unsuccessful due to short strings.  Prior ultrasound 09/21/12 documenting intrauterine IUD.  Uses IUD for control of cycles.  Vasectomy for contraception.   Objective  Consent for IUD removal and potential Mirena IUD placement. Speculum placed in vagina.  Sterile prep of cervix with Hibiclens. Paracervical block with 10 cc 1% lidocaine.  Tenaculum to anterior cervical lip.  Cervix dilated with prat dilators.  Dressing forceps and Novak curette used under ultrasound guidance with multiple attempts to remove IUD - unsuccessful. Procedure abandoned.  Minimal EBL. No complications.  Assessment  Malpositioned IUD with lost threads. History of menorrhagia.  Known fundal fibroid. Vasectomy for birth control.  Plan  IUD removal in the operating room.  I have also discussed options for treatment of menorrhagia including replacement with a new Mirena IUD, endometrial ablation, or even hysterectomy.  Patient is interested in a new Mirena IUD or endometrial ablation, thermal balloon.   She would like a precert of these procedures. She understands with endometrial ablation that the endometrium needs to be biopsied in office prior to ablating.   Will await insurance information.  Doxycycline 100 g po bid for 24 hours for infection prophylaxis.  15 additional minutes discussing above of which over 50% was spent in counseling.

## 2013-11-28 ENCOUNTER — Encounter: Payer: Self-pay | Admitting: Obstetrics and Gynecology

## 2013-12-02 ENCOUNTER — Other Ambulatory Visit: Payer: Self-pay | Admitting: *Deleted

## 2013-12-02 DIAGNOSIS — N926 Irregular menstruation, unspecified: Secondary | ICD-10-CM

## 2013-12-02 NOTE — Progress Notes (Signed)
Did patient decide to proceed with an endometrial ablation at the time her IUD is removed?

## 2013-12-07 ENCOUNTER — Telehealth: Payer: Self-pay | Admitting: Obstetrics and Gynecology

## 2013-12-07 NOTE — Progress Notes (Signed)
Precert of procedure options is in-process. Patient was contacted by business office.  Encounter closed.

## 2013-12-07 NOTE — Telephone Encounter (Signed)
Left message for patient advising that we are still working on getting the correct coding, etc for her surgery. Once complete, we will contact her with her benefits.

## 2013-12-08 NOTE — Telephone Encounter (Signed)
Pt is returning a call to [place]

## 2013-12-08 NOTE — Telephone Encounter (Signed)
Left message for patient to call back to go over benefits.

## 2013-12-08 NOTE — Progress Notes (Signed)
Returned call to patient. Went over benefits below:  ID: [id number] DOB: 06-04-1965 CPT: 62229 hysterocopy DX: T83.32/N92.6  Effective Date: 01.01.2015 Termination Date: 12.31.2015 Benefit period: cal year Pre-Existing: n/a  Copay:  Deductible: $700 (36met) OOP:   $3210 (73met) Coins: 80/20 PAC: n/a  Surgeon Allowed: [phone] Assistant Allowed: Total Allowed: $690.81   PR: $690.81   CPT: 58563/58301 ablation/iud removal DX: T83.32/N92.6 Copay:  Deductible: $700 (20met) OOP: $3210 (63met) Coins: 80/20 PAC: n/a  Surgeon Allowed: [phone] Assistant Allowed: Total Allowed: $964.61 PR: $752.92  CPT: 74081/K4818 mirena iud insertion Dx: N92.6  *service is covered with $30 copay PAC: n/a PR: $30   CPT: 56314 endometrial biopsy Dx: N92.6 Copay:  Deductible: OOP: Coins: PAC: n/a PR: $30  Patient states that she believes that she will "mull this over and make a decision regarding which option to go with and will call back to plan for the services at the beginning of the year." Patients plan deductible will start over at the beginning of the year.

## 2013-12-09 NOTE — Telephone Encounter (Signed)
Plan to wait until next year is acceptable.  Patient's husband has had a vasectomy, so contraception is not an issue.

## 2013-12-09 NOTE — Telephone Encounter (Signed)
Patient spoke to [place] regarding insurance benefits and options for both procedures. See order only encounter for this documented call with patient. Since patient has not met deductible for this year, she prefers to consider options and wait till 2016 to make decision and schedule. This wwill allow her to meet deductible and have it met for the year. Is this acceptable plan?

## 2013-12-09 NOTE — Telephone Encounter (Signed)
Encounter closed

## 2013-12-14 ENCOUNTER — Telehealth: Payer: Self-pay | Admitting: Obstetrics and Gynecology

## 2013-12-14 NOTE — Telephone Encounter (Signed)
Left message regarding upcoming appointment time change with Dr.Silva.

## 2013-12-29 NOTE — Progress Notes (Signed)
See next phone note. Patient decided to wait until beginning of year to schedule.

## 2014-03-06 ENCOUNTER — Telehealth: Payer: Self-pay | Admitting: *Deleted

## 2014-03-06 NOTE — Telephone Encounter (Signed)
Call to patient, Left message to call back. Ask for Gay Filler.

## 2014-03-08 NOTE — Telephone Encounter (Signed)
Pt returning call. 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

## 2014-03-09 NOTE — Telephone Encounter (Signed)
Patient returned call. Interested in financial options of both procedures. Advised will have Sabrina in billing office call her.

## 2014-03-09 NOTE — Telephone Encounter (Signed)
Left message for patient to call back  

## 2014-03-09 NOTE — Telephone Encounter (Signed)
Patient spoke with [place] regarding benefits. Then had additional questions regarding recovery time out of work. She is a Education officer, museum. Discussed procedure day plus 1-2 days depending on pain tolerance and recovery from anesthesia. Should expect some cramping for a day or two after procedure and may not feel at 100%. Should not drive for 24 hours. Patient will look at her schedule and consider dates and call me back when ready to schedule.  Routing to provider for final review. Patient agreeable to disposition. Will close encounter

## 2014-04-19 ENCOUNTER — Telehealth: Payer: Self-pay | Admitting: Obstetrics and Gynecology

## 2014-04-19 NOTE — Telephone Encounter (Signed)
Patient wants to schedule an appointment for Mirena removal.

## 2014-04-20 NOTE — Telephone Encounter (Signed)
Return call to patient. Left message to call back. 

## 2014-04-24 NOTE — Telephone Encounter (Signed)
Patient returned call. She is interested in proceeding with IUD removal and ablation.  Available date options and scheduling policies discussed. Discussed May 17 versus end of school year. Patient needed to know if she will be requited to have someone drive her home. Needs to make arrangements for this. Will confirm which dates works best for her and will call be back.

## 2014-05-04 NOTE — Telephone Encounter (Signed)
Call to patient. She is ready to proceed with  IUD removal and endometrial ablation on 06-13-14. Advised will proceed with scheduling and call her back.

## 2014-05-05 NOTE — Telephone Encounter (Signed)
Mary from [facility] Scheduling called requesting to speak with Gay Filler about the 11 AM case request not having enough time in the block.

## 2014-05-05 NOTE — Telephone Encounter (Signed)
Call to [employer], spoke to [place]. Surgery posted for 06-13-14 at 1230. Will move start time up as available.

## 2014-05-22 ENCOUNTER — Telehealth: Payer: Self-pay | Admitting: Obstetrics and Gynecology

## 2014-05-22 NOTE — Telephone Encounter (Signed)
See next phone note for surgery information.   Routing to provider for final review. Patient agreeable to disposition. Will close encounter

## 2014-05-22 NOTE — Telephone Encounter (Signed)
Spoke with patient. She is given pre op appointment, 1 month post op appointment and pre-surgical instructions.  Instructions mailed to patient home address of record.   Beginning 10 days before surgery, we ask that you do not take the following medications:  Aspirin, Vitamin E, NSAIDS (like Advil, Aleve, Motrin, Ibuprofen), Fish Oil, Herbal supplements.  You can take Tylenol or acetaminophen for any discomfort.  Do not take any other pain medications unless approved by your physician.  You may continue your regular multi-vitamin. No bowel prep needed.  DO NOT EAT OR DRINK ANYTHING AFTER MIDNIGHT ON THE NIGHT BEFORE YOUR SURGERY (INCLUDING WATER) UNLESS ADVISED TO DO SO BY THE HOSPITAL OR YOUR PHYSICIAN.  Dress casually the morning of surgery.  Do not take any valuables with you and do not wear makeup, jewelry or lotion.  You should not shave 48 hours prior to surgery.  Patient verbalized understanding.   Routing to provider for final review. Patient agreeable to disposition. Will close encounter

## 2014-05-22 NOTE — Telephone Encounter (Signed)
Pt thought she was going to have a consult appointment with Dr Quincy Simmonds before having surgery.

## 2014-05-31 ENCOUNTER — Encounter (HOSPITAL_COMMUNITY): Payer: Self-pay | Admitting: *Deleted

## 2014-06-01 ENCOUNTER — Encounter: Payer: Self-pay | Admitting: Obstetrics and Gynecology

## 2014-06-01 ENCOUNTER — Ambulatory Visit (INDEPENDENT_AMBULATORY_CARE_PROVIDER_SITE_OTHER): Payer: BC Managed Care – PPO | Admitting: Obstetrics and Gynecology

## 2014-06-01 VITALS — BP 108/70 | HR 78 | Ht 67.0 in | Wt 167.2 lb

## 2014-06-01 DIAGNOSIS — D259 Leiomyoma of uterus, unspecified: Secondary | ICD-10-CM

## 2014-06-01 DIAGNOSIS — T8332XS Displacement of intrauterine contraceptive device, sequela: Secondary | ICD-10-CM

## 2014-06-01 DIAGNOSIS — T8389XS Other specified complication of genitourinary prosthetic devices, implants and grafts, sequela: Secondary | ICD-10-CM | POA: Diagnosis not present

## 2014-06-01 NOTE — Progress Notes (Signed)
Patient ID: Chelsea [name], female   DOB: Jul 12, 1965, 49 y.o.   MRN: [id number] GYNECOLOGY  VISIT   HPI: 49 y.o.   Divorced  Caucasian  female   [phone] with Patient's last menstrual period was 04/24/2014 (approximate).   here to discuss upcoming surgery.  Has lost IUD threads in an expired Mirena.  Mirena placed on 08/04/08. IUD strings lost and not visible on pelvic exams.  Has had attempts in the office for removal with and without ultrasound guidance, and these have not been successful.  Ultrasound on 09/29/13 showed IUD in endometrial canal, thin EMS, fundal fibroid 3.4 x 2.1 cm, ovaries normal, no free fluid.   Uses IUD for menstrual control.  Wants endometrial ablation.   Declines future childbearing.   Last month had a regular menses 3 - 4 days and pad use.    Not sexually active currently.  Prior partner had a vasectomy.   GYNECOLOGIC HISTORY: Patient's last menstrual period was 04/24/2014 (approximate). Contraception:  has expired Mirena Menopausal hormone therapy: n/a Last pap:  08-30-12 wnl:neg HR HPV Last mammogram:  12-07-13 heterogeneously dense/nl:[facility]        OB History    Gravida Para Term Preterm AB TAB SAB Ectopic Multiple Living   3 2 2  1     2          Patient Active Problem List   Diagnosis Date Noted  . Chest heaviness 08/30/2013    Past Medical History  Diagnosis Date  . Enlarged thyroid     being monitored yearly  . Multiple thyroid nodules     being monitored yearly  . Encounter for insertion of mirena IUD 08/04/08  . SVD (spontaneous vaginal delivery)     x 2  . Seasonal allergies   . Depression 2004    History - situational  . Anemia     Past Surgical History  Procedure Laterality Date  . Thyroid biopsy  10/11    hyplerplastic thyroid nodule- multinodular goiter  . Appendectomy  1988  . Colposcopy  1994    ASCUS  . Wisdom tooth extraction      Current Outpatient Prescriptions  Medication Sig Dispense Refill  . doxycycline  (VIBRAMYCIN) 100 MG capsule Take 1 capsule (100 mg total) by mouth 2 (two) times daily. Take only 2 doses.  Take with food as can cause GI distress. (Patient not taking: Reported on 05/31/2014) 2 capsule 0  . levonorgestrel (MIRENA) 20 MCG/24HR IUD 1 each by Intrauterine route once.    . loratadine (CLARITIN) 10 MG tablet Take 10 mg by mouth daily as needed for allergies.    . Pseudoeph-Doxylamine-DM-APAP (NYQUIL PO) Take 1 tablet by mouth at bedtime.     No current facility-administered medications for this visit.     ALLERGIES: Sulfur and Mupirocin  Family History  Problem Relation Age of Onset  . Hypertension Mother   . Hypertension Father     History   Social History  . Marital Status: Divorced    Spouse Name: N/A  . Number of Children: N/A  . Years of Education: N/A   Occupational History  . Not on file.   Social History Main Topics  . Smoking status: Never Smoker   . Smokeless tobacco: Never Used  . Alcohol Use: 2.0 oz/week    4 Standard drinks or equivalent per week     Comment: socailly  . Drug Use: No  . Sexual Activity:    Partners: Male    Birth  Control/ Protection: IUD     Comment: Mirena IUD--placed 08-04-08   Other Topics Concern  . Not on file   Social History Narrative    ROS:  Pertinent items are noted in HPI.  PHYSICAL EXAMINATION:    Ht 5\' 7"  (1.702 m)  LMP 04/24/2014 (Approximate)     General appearance: alert, cooperative and appears stated age Lungs: clear to auscultation bilaterally Heart: regular rate and rhythm Abdomen: soft, non-tender; no masses,  no organomegaly No abnormal inguinal nodes palpated  Pelvic: External genitalia:  no lesions              Urethra:  normal appearing urethra with no masses, tenderness or lesions              Bartholins and Skenes: normal                 Vagina: normal appearing vagina with normal color and discharge, no lesions              Cervix: normal appearance.  IUD strings not seen.                     Bimanual Exam:  Uterus:  uterus is normal size, shape, consistency and nontender                                      Adnexa: normal adnexa in size, nontender and no masses                                 ASSESSMENT  Expired IUD.  Lost IUD threads.  Declines future childbearing.  Does not have a permanent method of contraception.  Fundal uterine fibroid. Desire for cycle control.   PLAN  Patient declines another attempt at removal of IUD in office today.   I do not think that endometrial biopsy is needed in advance of surgery.  Endometrial curettings will be done prior to endometrial ablation.   Discussion of Hysteroscopic IUD removal, endometrial curettage, Novasure endometrial ablation, and potential laparoscopic bilateral tubal ligation/salpingectomy.   I discussed that endometrial ablation is not a form of contraception and that tubal ligation or bilateral salpingectomy is typically performed due to need for contraception.  Patient quite upset with this discussion at this juncture and has financial concerns. I have reviewed with her that until recently she had a form of permanent contraception in her partner.  I have stated that if a tubal ligation is not performed that she will need a form of contraception which could be another Mirena placed in the office setting or a shorter acting contraceptive such as OCPS, NuvaRing or Ortho Evra if the need for contraception arises.  Discussed risks, benefits and, alternatives to surgery.  Risks include but are not limited to bleeding, infection, damage to surrounding organs including uterine perforation requiring hospitalization and laparoscopy, reaction to anesthesia, DVT, PE, death, need for further treatment and surgery, inability to perform the ablation, uterine scarring and pregnancy loss or ectopic pregnancy if tubal ligation/salpingectomy not performed.    Patient wishes to proceed with hysteroscopic removal of IUD and ablation and  is considering the laparoscopic tubal ligation versus salpingectomy.  Will have this precerted.   Patient indicates understanding of the discussion and recommendations and acknowledges that this is all meant to provide excellent education and care.  She understands that informed consent is necessary prior to proceeding with surgical procedures.   Surgical expectations and recovery discussed.     An After Visit Summary was printed and given to the patient.  __25____ minutes face to face time of which over 50% was spent in counseling.

## 2014-06-05 ENCOUNTER — Telehealth: Payer: Self-pay | Admitting: *Deleted

## 2014-06-05 NOTE — Telephone Encounter (Signed)
Call to patient, brief discussion of options for BTSP versus bilateral salpingectomy.  Patient has questions for business office I am unable to answer. She is aware that someone from business office will call her back tomorrow and she will let me know her decision.

## 2014-06-05 NOTE — Telephone Encounter (Signed)
Thank you for the update!

## 2014-06-06 NOTE — Telephone Encounter (Signed)
Spoke with patient. Explained coinsurance amounts for possible added surgery charges. Patient states understanding. Will think over and call back with a decision tomorrow.

## 2014-06-07 NOTE — Telephone Encounter (Signed)
Pt called today and states she will go ahead and have tubal ligation in addition to other procdures. Pt available after 230 for return call

## 2014-06-08 NOTE — Telephone Encounter (Signed)
Call to patient. Confirmed she wants tubal ligation, ligation of fallopian tubes with cautery and not removal of fallopian tubes, added to her planned procedure.

## 2014-06-09 NOTE — H&P (Signed)
Progress Notes      Nunzio Cobbs, MD at 06/01/2014 3:35 PM     Status: Signed       Expand All Collapse All   Patient ID: Chelsea [name], female DOB: 06-05-1965, 49 y.o. MRN: [id number] GYNECOLOGY VISIT  HPI: 49 y.o. Divorced Caucasian female  [phone] with Patient's last menstrual period was 04/24/2014 (approximate).  here to discuss upcoming surgery.  Has lost IUD threads in an expired Mirena. Mirena placed on 08/04/08. IUD strings lost and not visible on pelvic exams.  Has had attempts in the office for removal with and without ultrasound guidance, and these have not been successful.  Ultrasound on 09/29/13 showed IUD in endometrial canal, thin EMS, fundal fibroid 3.4 x 2.1 cm, ovaries normal, no free fluid.   Uses IUD for menstrual control.  Wants endometrial ablation.  Declines future childbearing.   Last month had a regular menses 3 - 4 days and pad use.   Not sexually active currently.  Prior partner had a vasectomy.   GYNECOLOGIC HISTORY: Patient's last menstrual period was 04/24/2014 (approximate). Contraception: has expired Mirena Menopausal hormone therapy: n/a Last pap: 08-30-12 wnl:neg HR HPV Last mammogram: 12-07-13 heterogeneously dense/nl:[facility]   OB History    Gravida Para Term Preterm AB TAB SAB Ectopic Multiple Living   3 2 2  1     2        Patient Active Problem List   Diagnosis Date Noted  . Chest heaviness 08/30/2013    Past Medical History  Diagnosis Date  . Enlarged thyroid     being monitored yearly  . Multiple thyroid nodules     being monitored yearly  . Encounter for insertion of mirena IUD 08/04/08  . SVD (spontaneous vaginal delivery)     x 2  . Seasonal allergies   . Depression 2004    History - situational  . Anemia     Past Surgical History  Procedure Laterality Date  .  Thyroid biopsy  10/11    hyplerplastic thyroid nodule- multinodular goiter  . Appendectomy  1988  . Colposcopy  1994    ASCUS  . Wisdom tooth extraction      Current Outpatient Prescriptions  Medication Sig Dispense Refill  . doxycycline (VIBRAMYCIN) 100 MG capsule Take 1 capsule (100 mg total) by mouth 2 (two) times daily. Take only 2 doses. Take with food as can cause GI distress. (Patient not taking: Reported on 05/31/2014) 2 capsule 0  . levonorgestrel (MIRENA) 20 MCG/24HR IUD 1 each by Intrauterine route once.    . loratadine (CLARITIN) 10 MG tablet Take 10 mg by mouth daily as needed for allergies.    . Pseudoeph-Doxylamine-DM-APAP (NYQUIL PO) Take 1 tablet by mouth at bedtime.     No current facility-administered medications for this visit.     ALLERGIES: Sulfur and Mupirocin  Family History  Problem Relation Age of Onset  . Hypertension Mother   . Hypertension Father     History   Social History  . Marital Status: Divorced    Spouse Name: N/A  . Number of Children: N/A  . Years of Education: N/A   Occupational History  . Not on file.   Social History Main Topics  . Smoking status: Never Smoker   . Smokeless tobacco: Never Used  . Alcohol Use: 2.0 oz/week  4 Standard drinks or equivalent per week     Comment: socailly  . Drug Use: No  . Sexual Activity:    Partners: Male    Birth Control/ Protection: IUD     Comment: Mirena IUD--placed 08-04-08   Other Topics Concern  . Not on file   Social History Narrative    ROS: Pertinent items are noted in HPI.  PHYSICAL EXAMINATION:   Ht 5\' 7"  (1.702 m)  LMP 04/24/2014 (Approximate)   General appearance: alert, cooperative and appears stated age Lungs: clear to auscultation bilaterally Heart: regular rate and rhythm Abdomen: soft, non-tender; no masses, no  organomegaly No abnormal inguinal nodes palpated  Pelvic: External genitalia: no lesions  Urethra: normal appearing urethra with no masses, tenderness or lesions  Bartholins and Skenes: normal   Vagina: normal appearing vagina with normal color and discharge, no lesions  Cervix: normal appearance. IUD strings not seen.     Bimanual Exam: Uterus: uterus is normal size, shape, consistency and nontender  Adnexa: normal adnexa in size, nontender and no masses   ASSESSMENT  Expired IUD.  Lost IUD threads.  Declines future childbearing.  Does not have a permanent method of contraception.  Fundal uterine fibroid. Desire for cycle control.   PLAN  Patient declines another attempt at removal of IUD in office today.   I do not think that endometrial biopsy is needed in advance of surgery. Endometrial curettings will be done prior to endometrial ablation.   Discussion of Hysteroscopic IUD removal, endometrial curettage, Novasure endometrial ablation, and potential laparoscopic bilateral tubal ligation/salpingectomy.   I discussed that endometrial ablation is not a form of contraception and that tubal ligation or bilateral salpingectomy is typically performed due to need for contraception.  Patient quite upset with this discussion at this juncture and has financial concerns. I have reviewed with her that until recently she had a form of permanent contraception in her partner.  I have stated that if a tubal ligation is not performed that she will need a form of contraception which could be another Mirena placed in the office setting or a shorter acting contraceptive such as OCPS, NuvaRing or Ortho Evra if the need for contraception arises.  Discussed risks, benefits and, alternatives to surgery.  Risks include but are not limited to bleeding,  infection, damage to surrounding organs including uterine perforation requiring hospitalization and laparoscopy, reaction to anesthesia, DVT, PE, death, need for further treatment and surgery, inability to perform the ablation, uterine scarring and pregnancy loss or ectopic pregnancy if tubal ligation/salpingectomy not performed.   Patient wishes to proceed with hysteroscopic removal of IUD and ablation and is considering the laparoscopic tubal ligation versus salpingectomy. Will have this precerted.   Patient indicates understanding of the discussion and recommendations and acknowledges that this is all meant to provide excellent education and care.  She understands that informed consent is necessary prior to proceeding with surgical procedures.   Surgical expectations and recovery discussed.    An After Visit Summary was printed and given to the patient.  __25____ minutes face to face time of which over 50% was spent in counseling.

## 2014-06-13 ENCOUNTER — Ambulatory Visit (HOSPITAL_COMMUNITY): Payer: BC Managed Care – PPO | Admitting: Anesthesiology

## 2014-06-13 ENCOUNTER — Encounter (HOSPITAL_COMMUNITY): Payer: Self-pay | Admitting: *Deleted

## 2014-06-13 ENCOUNTER — Encounter (HOSPITAL_COMMUNITY)
Admission: RE | Disposition: A | Discharge status: Home / Self Care | Payer: Self-pay | Source: Ambulatory Visit | Attending: Obstetrics and Gynecology

## 2014-06-13 ENCOUNTER — Ambulatory Visit (HOSPITAL_COMMUNITY)
Admission: RE | Admit: 2014-06-13 | Discharge: 2014-06-13 | Disposition: A | Discharge status: Home / Self Care | Payer: BC Managed Care – PPO | Source: Ambulatory Visit | Attending: Obstetrics and Gynecology | Admitting: Obstetrics and Gynecology

## 2014-06-13 DIAGNOSIS — Z302 Encounter for sterilization: Secondary | ICD-10-CM | POA: Diagnosis not present

## 2014-06-13 DIAGNOSIS — N920 Excessive and frequent menstruation with regular cycle: Secondary | ICD-10-CM | POA: Insufficient documentation

## 2014-06-13 DIAGNOSIS — D252 Subserosal leiomyoma of uterus: Secondary | ICD-10-CM | POA: Diagnosis not present

## 2014-06-13 DIAGNOSIS — N812 Incomplete uterovaginal prolapse: Secondary | ICD-10-CM | POA: Insufficient documentation

## 2014-06-13 DIAGNOSIS — N816 Rectocele: Secondary | ICD-10-CM | POA: Insufficient documentation

## 2014-06-13 DIAGNOSIS — T8389XS Other specified complication of genitourinary prosthetic devices, implants and grafts, sequela: Secondary | ICD-10-CM | POA: Diagnosis not present

## 2014-06-13 HISTORY — PX: LAPAROSCOPIC TUBAL LIGATION: SHX1937

## 2014-06-13 HISTORY — PX: HYSTEROSCOPY: SHX211

## 2014-06-13 HISTORY — DX: Other seasonal allergic rhinitis: J30.2

## 2014-06-13 HISTORY — DX: Major depressive disorder, single episode, unspecified: F32.9

## 2014-06-13 HISTORY — DX: Anemia, unspecified: D64.9

## 2014-06-13 HISTORY — PX: TUBAL LIGATION: SHX77

## 2014-06-13 HISTORY — PX: DILATION AND CURETTAGE OF UTERUS: SHX78

## 2014-06-13 LAB — BASIC METABOLIC PANEL
Anion gap: 8 (ref 5–15)
BUN: 12 mg/dL (ref 6–20)
CALCIUM: 9.1 mg/dL (ref 8.9–10.3)
CHLORIDE: 104 mmol/L (ref 101–111)
CO2: 25 mmol/L (ref 22–32)
CREATININE: 0.8 mg/dL (ref 0.44–1.00)
GFR calc non Af Amer: >60 mL/min (ref >60)
Glucose, Bld: 92 mg/dL (ref 65–99)
Potassium: 4 mmol/L (ref 3.5–5.1)
Sodium: 137 mmol/L (ref 135–145)

## 2014-06-13 LAB — CBC
HEMATOCRIT: 42.3 % (ref 36.0–46.0)
HEMOGLOBIN: 14.9 g/dL (ref 12.0–15.0)
MCH: 32.7 pg (ref 26.0–34.0)
MCHC: 35.2 g/dL (ref 30.0–36.0)
MCV: 93 fL (ref 78.0–100.0)
Platelets: 292 10*3/uL (ref 150–400)
RBC: 4.55 MIL/uL (ref 3.87–5.11)
RDW: 12 % (ref 11.5–15.5)
WBC: 5.4 10*3/uL (ref 4.0–10.5)

## 2014-06-13 LAB — PREGNANCY, URINE: Preg Test, Ur: NEGATIVE

## 2014-06-13 SURGERY — ABLATION, ENDOMETRIUM, HYSTEROSCOPIC
Anesthesia: General | Site: Vagina

## 2014-06-13 MED ORDER — ROCURONIUM BROMIDE 100 MG/10ML IV SOLN
INTRAVENOUS | Status: DC | PRN
  Administered 2014-06-13: 30 mg via INTRAVENOUS

## 2014-06-13 MED ORDER — LIDOCAINE HCL 1 % IJ SOLN
INTRAMUSCULAR | Status: DC | PRN
  Administered 2014-06-13: 10 mL

## 2014-06-13 MED ORDER — PROPOFOL 10 MG/ML IV BOLUS
INTRAVENOUS | Status: AC
  Filled 2014-06-13: qty 20

## 2014-06-13 MED ORDER — NEOSTIGMINE METHYLSULFATE 10 MG/10ML IV SOLN
INTRAVENOUS | Status: AC
  Filled 2014-06-13: qty 1

## 2014-06-13 MED ORDER — LACTATED RINGERS IR SOLN
Status: DC | PRN
  Administered 2014-06-13: 3000 mL

## 2014-06-13 MED ORDER — OXYCODONE HCL 5 MG/5ML PO SOLN: 5.0000 mg | Freq: Once | ORAL | Status: DC | PRN

## 2014-06-13 MED ORDER — HYDROMORPHONE HCL 1 MG/ML IJ SOLN
INTRAMUSCULAR | Status: DC | PRN
  Administered 2014-06-13 (×2): 0.5 mg via INTRAVENOUS

## 2014-06-13 MED ORDER — ONDANSETRON HCL 4 MG/2ML IJ SOLN
INTRAMUSCULAR | Status: AC
  Filled 2014-06-13: qty 2

## 2014-06-13 MED ORDER — ONDANSETRON HCL 4 MG/2ML IJ SOLN
INTRAMUSCULAR | Status: DC | PRN
  Administered 2014-06-13: 4 mg via INTRAVENOUS

## 2014-06-13 MED ORDER — LACTATED RINGERS IV SOLN: INTRAVENOUS | Status: DC

## 2014-06-13 MED ORDER — ACETAMINOPHEN 160 MG/5ML PO SOLN: 325.0000 mg | ORAL | Status: DC | PRN

## 2014-06-13 MED ORDER — LACTATED RINGERS IV SOLN
INTRAVENOUS | Status: DC
  Administered 2014-06-13 (×2): via INTRAVENOUS

## 2014-06-13 MED ORDER — FENTANYL CITRATE (PF) 100 MCG/2ML IJ SOLN: 25.0000 ug | INTRAMUSCULAR | Status: DC | PRN

## 2014-06-13 MED ORDER — MIDAZOLAM HCL 2 MG/2ML IJ SOLN
INTRAMUSCULAR | Status: AC
  Filled 2014-06-13: qty 2

## 2014-06-13 MED ORDER — HYDROMORPHONE HCL 1 MG/ML IJ SOLN
INTRAMUSCULAR | Status: AC
  Filled 2014-06-13: qty 1

## 2014-06-13 MED ORDER — NEOSTIGMINE METHYLSULFATE 10 MG/10ML IV SOLN
INTRAVENOUS | Status: DC | PRN
  Administered 2014-06-13: 2 mg via INTRAVENOUS

## 2014-06-13 MED ORDER — KETOROLAC TROMETHAMINE 30 MG/ML IJ SOLN
INTRAMUSCULAR | Status: DC | PRN
  Administered 2014-06-13: 30 mg via INTRAVENOUS

## 2014-06-13 MED ORDER — PROPOFOL 10 MG/ML IV BOLUS
INTRAVENOUS | Status: DC | PRN
  Administered 2014-06-13: 170 mg via INTRAVENOUS

## 2014-06-13 MED ORDER — OXYCODONE-ACETAMINOPHEN 5-325 MG PO TABS: 2.0000 | ORAL_TABLET | ORAL | Status: DC | PRN

## 2014-06-13 MED ORDER — SCOPOLAMINE 1 MG/3DAYS TD PT72
MEDICATED_PATCH | TRANSDERMAL | Status: AC
  Filled 2014-06-13: qty 1

## 2014-06-13 MED ORDER — OXYCODONE HCL 5 MG PO TABS: 5.0000 mg | ORAL_TABLET | Freq: Once | ORAL | Status: DC | PRN

## 2014-06-13 MED ORDER — DEXAMETHASONE SODIUM PHOSPHATE 10 MG/ML IJ SOLN
INTRAMUSCULAR | Status: AC
  Filled 2014-06-13: qty 1

## 2014-06-13 MED ORDER — CEFAZOLIN SODIUM-DEXTROSE 2-3 GM-% IV SOLR: 2.0000 g | INTRAVENOUS | Status: DC

## 2014-06-13 MED ORDER — LIDOCAINE HCL (CARDIAC) 20 MG/ML IV SOLN
INTRAVENOUS | Status: AC
  Filled 2014-06-13: qty 5

## 2014-06-13 MED ORDER — CEFAZOLIN SODIUM-DEXTROSE 2-3 GM-% IV SOLR
INTRAVENOUS | Status: AC
  Administered 2014-06-13: 2 g via INTRAVENOUS
  Filled 2014-06-13: qty 50

## 2014-06-13 MED ORDER — FENTANYL CITRATE (PF) 100 MCG/2ML IJ SOLN
INTRAMUSCULAR | Status: DC | PRN
  Administered 2014-06-13: 50 ug via INTRAVENOUS
  Administered 2014-06-13: 100 ug via INTRAVENOUS

## 2014-06-13 MED ORDER — GLYCOPYRROLATE 0.2 MG/ML IJ SOLN
INTRAMUSCULAR | Status: DC | PRN
  Administered 2014-06-13: 0.4 mg via INTRAVENOUS

## 2014-06-13 MED ORDER — BUPIVACAINE HCL (PF) 0.25 % IJ SOLN
INTRAMUSCULAR | Status: DC | PRN
  Administered 2014-06-13: 5 mL

## 2014-06-13 MED ORDER — LIDOCAINE HCL (CARDIAC) 20 MG/ML IV SOLN
INTRAVENOUS | Status: DC | PRN
  Administered 2014-06-13: 100 mg via INTRAVENOUS

## 2014-06-13 MED ORDER — SCOPOLAMINE 1 MG/3DAYS TD PT72
1.0000 | MEDICATED_PATCH | Freq: Once | TRANSDERMAL | Status: DC
  Administered 2014-06-13: 1.5 mg via TRANSDERMAL

## 2014-06-13 MED ORDER — FENTANYL CITRATE (PF) 250 MCG/5ML IJ SOLN
INTRAMUSCULAR | Status: AC
  Filled 2014-06-13: qty 5

## 2014-06-13 MED ORDER — LIDOCAINE HCL 1 % IJ SOLN
INTRAMUSCULAR | Status: AC
  Filled 2014-06-13: qty 20

## 2014-06-13 MED ORDER — ACETAMINOPHEN 325 MG PO TABS: 325.0000 mg | ORAL_TABLET | ORAL | Status: DC | PRN

## 2014-06-13 MED ORDER — DEXAMETHASONE SODIUM PHOSPHATE 4 MG/ML IJ SOLN
INTRAMUSCULAR | Status: DC | PRN
  Administered 2014-06-13: 4 mg via INTRAVENOUS

## 2014-06-13 MED ORDER — BUPIVACAINE HCL (PF) 0.25 % IJ SOLN
INTRAMUSCULAR | Status: AC
  Filled 2014-06-13: qty 30

## 2014-06-13 MED ORDER — MIDAZOLAM HCL 2 MG/2ML IJ SOLN
INTRAMUSCULAR | Status: DC | PRN
  Administered 2014-06-13: 2 mg via INTRAVENOUS

## 2014-06-13 MED ORDER — ROCURONIUM BROMIDE 100 MG/10ML IV SOLN
INTRAVENOUS | Status: AC
  Filled 2014-06-13: qty 1

## 2014-06-13 MED ORDER — IBUPROFEN 800 MG PO TABS: 800.0000 mg | ORAL_TABLET | Freq: Three times a day (TID) | ORAL | Status: DC | PRN

## 2014-06-13 SURGICAL SUPPLY — 26 items
APL SKNCLS STERI-STRIP NONHPOA (GAUZE/BANDAGES/DRESSINGS) ×2
BENZOIN TINCTURE PRP APPL 2/3 (GAUZE/BANDAGES/DRESSINGS) ×4 IMPLANT
CANISTER SUCT 3000ML (MISCELLANEOUS) ×4 IMPLANT
CATH ROBINSON RED A/P 16FR (CATHETERS) ×4 IMPLANT
CLOSURE WOUND 1/4X4 (GAUZE/BANDAGES/DRESSINGS) ×1
CLOTH BEACON ORANGE TIMEOUT ST (SAFETY) ×4 IMPLANT
CONTAINER PREFILL 10% NBF 60ML (FORM) ×8 IMPLANT
DRSG COVADERM PLUS 2X2 (GAUZE/BANDAGES/DRESSINGS) ×8 IMPLANT
DRSG OPSITE POSTOP 3X4 (GAUZE/BANDAGES/DRESSINGS) ×2 IMPLANT
GLOVE BIO SURGEON STRL SZ 6.5 (GLOVE) ×6 IMPLANT
GLOVE BIO SURGEONS STRL SZ 6.5 (GLOVE) ×2
GOWN STRL REUS W/TWL LRG LVL3 (GOWN DISPOSABLE) ×8 IMPLANT
LIQUID BAND (GAUZE/BANDAGES/DRESSINGS) ×2 IMPLANT
PACK LAPAROSCOPY BASIN (CUSTOM PROCEDURE TRAY) ×4 IMPLANT
PACK VAGINAL MINOR WOMEN LF (CUSTOM PROCEDURE TRAY) ×4 IMPLANT
PAD OB MATERNITY 4.3X12.25 (PERSONAL CARE ITEMS) ×4 IMPLANT
PAD POSITIONER PINK NONSTERILE (MISCELLANEOUS) ×4 IMPLANT
SET GENESYS HTA PROCERVA (MISCELLANEOUS) ×4 IMPLANT
SOLUTION ELECTROLUBE (MISCELLANEOUS) ×2 IMPLANT
STRIP CLOSURE SKIN 1/4X4 (GAUZE/BANDAGES/DRESSINGS) ×3 IMPLANT
SUT VIC AB 4-0 PS2 27 (SUTURE) ×2 IMPLANT
SUT VICRYL 0 UR6 27IN ABS (SUTURE) ×2 IMPLANT
TOWEL OR 17X24 6PK STRL BLUE (TOWEL DISPOSABLE) ×8 IMPLANT
TRAY FOLEY CATH SILVER 14FR (SET/KITS/TRAYS/PACK) ×4 IMPLANT
WARMER LAPAROSCOPE (MISCELLANEOUS) ×4 IMPLANT
WATER STERILE IRR 1000ML POUR (IV SOLUTION) ×4 IMPLANT

## 2014-06-13 NOTE — Discharge Instructions (Signed)
Endometrial Ablation Endometrial ablation removes the lining of the uterus (endometrium). It is usually a same-day, outpatient treatment. Ablation helps avoid major surgery, such as surgery to remove the cervix and uterus (hysterectomy). After endometrial ablation, you will have little or no menstrual bleeding and may not be able to have children. However, if you are premenopausal, you will need to use a reliable method of birth control following the procedure because of the small chance that pregnancy can occur. There are different reasons to have this procedure, which include:  Heavy periods.  Bleeding that is causing anemia.  Irregular bleeding.  Bleeding fibroids on the lining inside the uterus if they are smaller than 3 centimeters. This procedure should not be done if:  You want children in the future.  You have severe cramps with your menstrual period.  You have precancerous or cancerous cells in your uterus.  You were recently pregnant.  You have gone through menopause.  You have had major surgery on the uterus, such as a cesarean delivery. LET [facility] CARE PROVIDER KNOW ABOUT:  Any allergies you have.  All medicines you are taking, including vitamins, herbs, eye drops, creams, and over-the-counter medicines.  Previous problems you or members of your family have had with the use of anesthetics.  Any blood disorders you have.  Previous surgeries you have had.  Medical conditions you have. RISKS AND COMPLICATIONS  Generally, this is a safe procedure. However, as with any procedure, complications can occur. Possible complications include:  Perforation of the uterus.  Bleeding.  Infection of the uterus, bladder, or vagina.  Injury to surrounding organs.  An air bubble to the lung (air embolus).  Pregnancy following the procedure.  Failure of the procedure to help the problem, requiring hysterectomy.  Decreased ability to diagnose cancer in the lining of  the uterus. BEFORE THE PROCEDURE  The lining of the uterus must be tested to make sure there is no pre-cancerous or cancer cells present.  An ultrasound may be performed to look at the size of the uterus and to check for abnormalities.  Medicines may be given to thin the lining of the uterus. PROCEDURE  During the procedure, your health care provider will use a tool called a resectoscope to help see inside your uterus. There are different ways to remove the lining of your uterus.   Radiofrequency - This method uses a radiofrequency-alternating electric current to remove the lining of the uterus.  Cryotherapy - This method uses extreme cold to freeze the lining of the uterus.  Heated-Free Liquid - This method uses heated salt (saline) solution to remove the lining of the uterus.  Microwave - This method uses high-energy microwaves to heat up the lining of the uterus to remove it.  Thermal balloon - This method involves inserting a catheter with a balloon tip into the uterus. The balloon tip is filled with heated fluid to remove the lining of the uterus. AFTER THE PROCEDURE  After your procedure, do not have sexual intercourse or insert anything into your vagina until permitted by your health care provider. After the procedure, you may experience:  Cramps.  Vaginal discharge.  Frequent urination. Document Released: 11/23/2003 Document Revised: 09/15/2012 Document Reviewed: 06/16/2012 [facility] Patient Information 2015 [place], [place]. This information is not intended to replace advice given to you by your health care provider. Make sure you discuss any questions you have with your health care provider.  Dilation and Curettage or Vacuum Curettage, Care After Refer to this sheet  in the next few weeks. These instructions provide you with information on caring for yourself after your procedure. Your health care provider may also give you more specific instructions. Your treatment has been  planned according to current medical practices, but problems sometimes occur. Call your health care provider if you have any problems or questions after your procedure. WHAT TO EXPECT AFTER THE PROCEDURE After your procedure, it is typical to have light cramping and bleeding. This may last for 2 days to 2 weeks after the procedure. HOME CARE INSTRUCTIONS   Do not drive for 24 hours.  Wait 1 week before returning to strenuous activities.  Take your temperature 2 times a day for 4 days and write it down. Provide these temperatures to your health care provider if you develop a fever.  Avoid long periods of standing.  Avoid heavy lifting, pushing, or pulling. Do not lift anything heavier than 10 pounds (4.5 kg).  Limit stair climbing to once or twice a day.  Take rest periods often.  You may resume your usual diet.  Drink enough fluids to keep your urine clear or pale yellow.  Your usual bowel function should return. If you have constipation, you may:  Take a mild laxative with permission from your health care provider.  Add fruit and bran to your diet.  Drink more fluids.  Take showers instead of baths until your health care provider gives you permission to take baths.  Do not go swimming or use a hot tub until your health care provider approves.  Try to have someone with you or available to you the first 24-48 hours, especially if you were given a general anesthetic.  Do not douche, use tampons, or have sex (intercourse) for 2 weeks after the procedure.  Only take over-the-counter or prescription medicines as directed by your health care provider. Do not take aspirin. It can cause bleeding.  Follow up with your health care provider as directed. SEEK MEDICAL CARE IF:   You have increasing cramps or pain that is not relieved with medicine.  You have abdominal pain that does not seem to be related to the same area of earlier cramping and pain.  You have bad smelling vaginal  discharge.  You have a rash.  You are having problems with any medicine. SEEK IMMEDIATE MEDICAL CARE IF:   You have bleeding that is heavier than a normal menstrual period.  You have a fever.  You have chest pain.  You have shortness of breath.  You feel dizzy or feel like fainting.  You pass out.  You have pain in your shoulder strap area.  You have heavy vaginal bleeding with or without blood clots. MAKE SURE YOU:   Understand these instructions.  Will watch your condition.  Will get help right away if you are not doing well or get worse. Document Released: 01/11/2000 Document Revised: 01/18/2013 Document Reviewed: 08/12/2012 [facility] Patient Information 2015 [place], [place]. This information is not intended to replace advice given to you by your health care provider. Make sure you discuss any questions you have with your health care provider.  Hysteroscopy, Care After Refer to this sheet in the next few weeks. These instructions provide you with information on caring for yourself after your procedure. Your health care provider may also give you more specific instructions. Your treatment has been planned according to current medical practices, but problems sometimes occur. Call your health care provider if you have any problems or questions after your procedure.  WHAT TO EXPECT AFTER THE PROCEDURE After your procedure, it is typical to have the following:  You may have some cramping. This normally lasts for a couple days.  You may have bleeding. This can vary from light spotting for a few days to menstrual-like bleeding for 3-7 days. HOME CARE INSTRUCTIONS  Rest for the first 1-2 days after the procedure.  Only take over-the-counter or prescription medicines as directed by your health care provider. Do not take aspirin. It can increase the chances of bleeding.  Take showers instead of baths for 2 weeks or as directed by your health care provider.  Do not drive for 24  hours or as directed.  Do not drink alcohol while taking pain medicine.  Do not use tampons, douche, or have sexual intercourse for 2 weeks or until your health care provider says it is okay.  Take your temperature twice a day for 4-5 days. Write it down each time.  Follow your health care provider's advice about diet, exercise, and lifting.  If you develop constipation, you may:  Take a mild laxative if your health care provider approves.  Add bran foods to your diet.  Drink enough fluids to keep your urine clear or pale yellow.  Try to have someone with you or available to you for the first 24-48 hours, especially if you were given a general anesthetic.  Follow up with your health care provider as directed. SEEK MEDICAL CARE IF:  You feel dizzy or lightheaded.  You feel sick to your stomach (nauseous).  You have abnormal vaginal discharge.  You have a rash.  You have pain that is not controlled with medicine.  Laparoscopic Tubal Ligation Care After Refer to this sheet in the next few weeks. These instructions provide you with information on caring for yourself after your procedure. Your caregiver may also give you more specific instructions. Your treatment has been planned according to current medical practices, but problems sometimes occur. Call your caregiver if you have any problems or questions after your procedure. HOME CARE INSTRUCTIONS   Rest the remainder of the day.  Only take over-the-counter or prescription medicines for pain, discomfort, or fever as directed by your caregiver. Do not take aspirin. It can cause bleeding.  Gradually resume daily activities, diet, rest, driving, and work.  Avoid sexual intercourse for 2 weeks or as directed.  Do not use tampons or douche.  Do not drive while taking pain medicine.  Do not lift anything over 5 pounds for 2 weeks or as directed.  Only take showers, not baths, until you are seen by your caregiver.  Change  bandages (dressings) as directed.  Take your temperature twice a day and record it.  Try to have help for the first 7 to 10 days for your household needs.  Return to your caregiver to get your stitches (sutures) removed and for follow-up visits as directed. SEEK MEDICAL CARE IF:   You have redness, swelling, or increasing pain in a wound.  You have drainage from a wound lasting longer than 1 day.  Your pain is getting worse.  You have a rash.  You become dizzy or lightheaded.  You have a reaction to your medicine.  You need stronger medicine or a change in your pain medicine.  You notice a bad smell coming from a wound or dressing.  Your wound breaks open after the sutures have been removed.  You are constipated. SEEK IMMEDIATE MEDICAL CARE IF:   You faint.  You  have a fever.  You have increasing abdominal pain.  You have severe pain in your shoulders.  You have bleeding or drainage from the suture sites or vagina following surgery.  You have shortness of breath or difficulty breathing.  You have chest or leg pain.  You have persistent nausea, vomiting, or diarrhea. MAKE SURE YOU:   Understand these instructions.  Watch your condition.  Get help right away if you are not doing well or get worse. Document Released: 08/02/2004 Document Revised: 07/15/2011 Document Reviewed: 04/26/2011 [facility] Patient Information 2015 [place], [place]. This information is not intended to replace advice given to you by your health care provider. Make sure you discuss any questions you have with your health care provider. SEEK IMMEDIATE MEDICAL CARE IF:  You have bleeding that is heavier than a normal menstrual period.  You have a fever.  You have increasing cramps or pain, not controlled with medicine.  You have new belly (abdominal) pain.  You pass out.  You have pain in the tops of your shoulders (shoulder strap areas).  You have shortness of breath. Document  Released: 11/03/2012 Document Reviewed: 11/03/2012 [facility] Patient Information 2015 [place], [place]. This information is not intended to replace advice given to you by your health care provider. Make sure you discuss any questions you have with your health care provider.

## 2014-06-13 NOTE — Brief Op Note (Signed)
06/13/2014  7:36 PM  PATIENT:  Chelsea [name]  49 y.o. female  PRE-OPERATIVE DIAGNOSIS:  malpositioned IUD, menorrhagia, fundal fibroid, desires sterilization   POST-OPERATIVE DIAGNOSIS:  malpositioned IUD, menorrhagia, fundal fibroid, desires sterilization   PROCEDURE:  Procedure(s): HYSTEROSCOPY WITH NOVASURE ABLATION and removal of malpositioned IUD  (N/A) LAPAROSCOPIC BILATERAL TUBAL LIGATION WITH CAUTERY  (N/A) DILATATION AND CURETTAGE (N/A)  SURGEON:  Surgeon(s) and Role:    * Brook E Yisroel Ramming, MD - Primary  PHYSICIAN ASSISTANT: NA  ASSISTANTS: none   ANESTHESIA:   local, general and paracervical block  EBL:  Total I/O In: -  Out: 325 [Urine:300; Blood:25]  BLOOD ADMINISTERED:none  DRAINS: none   LOCAL MEDICATIONS USED:  MARCAINE    and LIDOCAINE   SPECIMEN:  Source of Specimen:  Endometrial curettings  DISPOSITION OF SPECIMEN:  PATHOLOGY  COUNTS:  YES  TOURNIQUET:  * No tourniquets in log *  DICTATION: .Other Dictation: Dictation Number    PLAN OF CARE: Discharge to home after PACU  PATIENT DISPOSITION:  PACU - hemodynamically stable.   Delay start of Pharmacological VTE agent (>24hrs) due to surgical blood loss or risk of bleeding: not applicable

## 2014-06-13 NOTE — Progress Notes (Signed)
Update Note  No marked change in status since office pre-op visit.   Patient examined.   OK to proceed with surgery.

## 2014-06-13 NOTE — Transfer of Care (Signed)
Immediate Anesthesia Transfer of Care Note  Patient: Chelsea [name]  Procedure(s) Performed: Procedure(s): HYSTEROSCOPY WITH NOVASURE ABLATION and removal of malpositioned IUD  (N/A) LAPAROSCOPIC BILATERAL TUBAL LIGATION WITH CAUTERY  (N/A) DILATATION AND CURETTAGE (N/A)  Patient Location: PACU  Anesthesia Type:General  Level of Consciousness: awake, alert  and oriented  Airway & Oxygen Therapy: Patient Spontanous Breathing and Patient connected to nasal cannula oxygen  Post-op Assessment: Report given to RN and Post -op Vital signs reviewed and stable  Post vital signs: Reviewed and stable  Last Vitals:  Filed Vitals:   06/13/14 1631  BP: 140/89  Pulse: 62  Temp: 36.7 C  Resp: 18    Complications: No apparent anesthesia complications

## 2014-06-13 NOTE — Anesthesia Preprocedure Evaluation (Addendum)
Anesthesia Evaluation  Patient identified by MRN, date of birth, ID band Patient awake    Reviewed: Allergy & Precautions, H&P , Patient's Chart, lab work & pertinent test results, reviewed documented beta blocker date and time   Airway Mallampati: I  TM Distance: >3 FB Neck ROM: full    Dental no notable dental hx.    Pulmonary    Pulmonary exam normal       Cardiovascular Normal cardiovascular exam    Neuro/Psych    GI/Hepatic   Endo/Other    Renal/GU      Musculoskeletal   Abdominal Normal abdominal exam  (+)   Peds  Hematology   Anesthesia Other Findings   Reproductive/Obstetrics                            Anesthesia Physical Anesthesia Plan  ASA: II  Anesthesia Plan: General   Post-op Pain Management:    Induction: Intravenous  Airway Management Planned: Oral ETT  Additional Equipment:   Intra-op Plan:   Post-operative Plan: Extubation in OR  Informed Consent: I have reviewed the patients History and Physical, chart, labs and discussed the procedure including the risks, benefits and alternatives for the proposed anesthesia with the patient or authorized representative who has indicated his/her understanding and acceptance.   Dental Advisory Given and Dental advisory given  Plan Discussed with: CRNA and Surgeon  Anesthesia Plan Comments: (  Discussed general anesthesia, including possible nausea, instrumentation of airway, sore throat,pulmonary aspiration, etc. I asked if the were any outstanding questions, or  concerns before we proceeded. )        Anesthesia Quick Evaluation

## 2014-06-13 NOTE — Anesthesia Procedure Notes (Signed)
Procedure Name: Intubation Date/Time: 06/13/2014 6:03 PM Performed by: Flossie Dibble Pre-anesthesia Checklist: Patient identified, Emergency Drugs available, Suction available and Patient being monitored Patient Re-evaluated:Patient Re-evaluated prior to inductionOxygen Delivery Method: Circle system utilized Preoxygenation: Pre-oxygenation with 100% oxygen Intubation Type: IV induction Ventilation: Mask ventilation without difficulty Laryngoscope Size: Mac and 3 Tube type: Oral Number of attempts: 1 Airway Equipment and Method: Stylet Placement Confirmation: ETT inserted through vocal cords under direct vision,  positive ETCO2 and breath sounds checked- equal and bilateral Secured at: 20 cm Tube secured with: Tape Dental Injury: Teeth and Oropharynx as per pre-operative assessment

## 2014-06-14 ENCOUNTER — Encounter (HOSPITAL_COMMUNITY): Payer: Self-pay | Admitting: Obstetrics and Gynecology

## 2014-06-14 NOTE — Anesthesia Postprocedure Evaluation (Signed)
  Anesthesia Post-op Note  Patient: Chelsea [name]  Procedure(s) Performed: Procedure(s): HYSTEROSCOPY WITH NOVASURE ABLATION and removal of malpositioned IUD  (N/A) LAPAROSCOPIC BILATERAL TUBAL LIGATION WITH CAUTERY  (N/A) DILATATION AND CURETTAGE (N/A)  Patient Location: PACU  Anesthesia Type:MAC  Level of Consciousness: awake  Airway and Oxygen Therapy: Patient Spontanous Breathing  Post-op Pain: mild  Post-op Assessment: Post-op Vital signs reviewed, Patient's Cardiovascular Status Stable, Respiratory Function Stable, Patent Airway, No signs of Nausea or vomiting and Pain level controlled  Post-op Vital Signs: Reviewed and stable  Last Vitals:  Filed Vitals:   06/13/14 2045  BP:   Pulse: 60  Temp: 36.8 C  Resp: 18    Complications: No apparent anesthesia complications

## 2014-06-14 NOTE — Op Note (Addendum)
Chelsea [name], Chelsea [name] NO.:  [id number]  MEDICAL RECORD NO.:  [id number]  LOCATION:  [facility]                          FACILITY:  [facility]  PHYSICIAN:  Lenard Galloway, M.D.   DATE OF BIRTH:  1965/08/19  DATE OF PROCEDURE:  06/13/2014 DATE OF DISCHARGE:  06/13/2014                              OPERATIVE REPORT   PREOPERATIVE DIAGNOSES:  Short intrauterine device strings, history of menorrhagia, fundal uterine fibroid, desires permanent sterilization.  POSTOPERATIVE DIAGNOSES:  Short intrauterine device strings, history of menorrhagia, fundal fibroid, desires permanent sterilization.  PROCEDURE:  Examination under anesthesia, laparoscopic bilateral tubal ligation with bipolar cautery, hysteroscopic removal of Mirena IUD, dilation and curettage, NovaSure endometrial ablation.  SURGEON:  Lenard Galloway, MD  ANESTHESIA:  General endotracheal, local with 0.25% Marcaine and paracervical block with 10 mL of 1% lidocaine.  IV FLUIDS:  1500 mL Ringer's lactate.  ESTIMATED BLOOD LOSS:  25 mL.  URINE OUTPUT:  300 mL.    COMPLICATIONS:  None.  INDICATIONS FOR THE PROCEDURE:  The patient is a 49 year old gravida 3, para 2-0-1-2, Caucasian female who presents with an expired Mirena IUD and desire for removal.  The Mirena IUD was placed on August 04, 2008.  The IUD strings have not been visible with pelvic exam and ultrasound confirmed placement inside the uterine cavity.  Office attempts for removal of the IUD including under ultrasound guidance were not successful.  The patient does have a fundal fibroid measuring 3.4 cm in the largest diameter.  Her IUD was placed originally for treatment of menorrhagia.  She now presents requesting removal of the IUD, endometrial ablation and permanent sterilization.  A plan is therefore made to proceed with a hysteroscopic removal of the IUD, dilation and curettage, NovaSure endometrial ablation and laparoscopic bilateral tubal ligation  with bipolar cautery.    Addendum - The patient understands that there is a failure rate of tubal ligation of approximately 1 in 250 to 1 in 300 which may result in either an intrauterine or ectopic pregnancy.  FINDINGS:  There was evidence of a first-degree cystocele, first-degree rectocele and first-degree uterine prolapse with exam under anesthesia. Hysteroscopy demonstrated a clot inside the uterine cavity.  The IUD was noted to be intact.  There was no evidence of any endometrial polyps or fibroids.  The IUD strings were noted to be short. Laparoscopy demonstrated the uterus to have a 3.5 cm fundal subserosal fibroid.  The fallopian tubes were noted to be normal.  There was a 1 cm left hydatid cyst of the fallopian tube.  The ovaries were normal. There was no evidence of any endometriosis in the abdomen or pelvis.  In the upper abdomen, the liver appeared to be normal.  SPECIMENS:  Endometrial curettings were sent to Pathology.  PROCEDURE IN DETAIL:  The patient was reidentified in the preoperative hold area.  She did receive Ancef 2 g IV for antibiotic prophylaxis and she received TED hose and PAS stockings for DVT prophylaxis.  In the operating room, the patient was placed in the dorsal lithotomy position.  Her left arm was tucked at her side and her right arm was placed on an arm board.  General endotracheal anesthesia was induced. The lower abdomen, vagina and perineum were then sterilely prepped and a Foley catheter was placed inside the bladder.  She was sterilely draped.  A speculum was placed inside the vagina and a single-tooth tenaculum was placed on the anterior cervical lip.  A Hulka tenaculum was used to replace the single-tooth tenaculum and the remaining vaginal instruments were removed.  Attention was turned to the abdomen where the inferior umbilicus was injected locally with 0.25% Marcaine.  An Allis clamp was used to elevate the inferior umbilicus and a 1 cm  inferior umbilical incision was created with a scalpel.  Dissection down to the fascia was performed with an Allis clamp.  A 10-mm trocar was then inserted directly into the peritoneal cavity without difficulty.  The laparoscope confirmed proper placement.  The CO2 pneumoperitoneum was achieved and the patient was then placed in the Trendelenburg position.  A 5-mm suprapubic incision was then created 2 fingerbreadths above the pubic symphysis.  The skin was injected first locally with 0.25% Marcaine.  The 5 mm trocar was placed under direct visualization of the laparoscope.  An inspection of the pelvic and abdominal organs was performed. Kleppinger forceps was then used to cauterize a 2.5 cm segment of the isthmic portions of both the left and the right fallopian tube.  The fallopian tubes were followed all the way to the fimbriated end prior to cauterizing them. The mesosalpinx of the right fallopian tube did bleed slightly and this responded well to further bipolar cautery with the Kleppinger forceps.  Hemostasis was then excellent. Care was taken To avoid the right infundibulopelvic ligament.  The Kleppinger forceps was removed at this time.  The laparoscope was left in place as the hysteroscopy with NovaSure proceeded, so that visualization of the uterine fundus and a fundal fibroid could be possible if needed.  The patient was then taken out of the Trendelenburg position.  Attention was turned to the vaginal portion of the procedure.  A speculum was placed inside the vagina.  A Hulka tenaculum was removed.  A single- tooth tenaculum was placed on the anterior cervical lip.  A paracervical block was performed with a total of 10 mL of 1% lidocaine plain.  After cervical length was measured at 3.5 cm, the uterus was then sounded to 8.5 cm.  The cervix was dilated to a #21 Pratt dilator.  The diagnostic hysteroscope was inserted into the uterine cavity under the continuous infusion  of lactated Ringer's solution.  The findings are as noted above.  A grasper was placed through the port of the diagnostic hysteroscope and the IUD strings were grasped and the hysteroscope and IUD were removed together.  The IUD was intact. It was discarded.  The cervix was then further dilated to a #25 Pratt dilator.  The endometrial curettings were obtained at this time with a serrated curette.  The specimen was sent to Pathology.  The NovaSure device was then inserted into the uterine cavity to the level of the uterine fundus and withdrawn slightly.  The NovaSure was locked into place.  The cavity width was measured at 4 cm.  The CO2 puff test was performed and the patient passed the test.  The NovaSure ablation was then performed without difficulty.  The NovaSure device was removed from the uterine cavity.  All instruments were removed from the vagina.  Hemostasis was good.  Attention was then turned to the abdomen where final laparoscopy was performed.  Hemostasis was noted to  be good.  The lower abdominal trocar was removed under direct visualization of the laparoscope.  The laparoscope was then removed and the CO2 pneumoperitoneum was released. The patient received manual breaths to remove any remaining CO2 gas. The umbilical trocar was removed.  Kocher clamps were used to grasp the fascia of the umbilical incision and a single through and through suture of 0 Vicryl was placed.  The skin incisions were closed with a subcuticular suture of 4-0 Vicryl on the umbilicus and then Dermabond was placed over this.  The lower incision was closed with just Dermabond.  Sterile bandages were placed over the incision.  The patient was awakened and extubated, and escorted to the recovery room in stable and awake condition.  There were no complications to the procedure.  All needle, instrument, sponge counts were correct.     Lenard Galloway, M.D.     BES/MEDQ  D:  06/13/2014  T:   06/14/2014  Job:  [id number]

## 2014-06-15 ENCOUNTER — Telehealth: Payer: Self-pay

## 2014-06-15 NOTE — Telephone Encounter (Signed)
Left message to call Kaitlyn at [phone]. 

## 2014-06-15 NOTE — Telephone Encounter (Signed)
Left message to call Taelar Gronewold at [phone]. 

## 2014-06-15 NOTE — Telephone Encounter (Signed)
-----   Message from Nunzio Cobbs, MD sent at 06/15/2014  2:19 PM EDT ----- Please let patient know that her surgical pathology showed benign endometrium.  I hope she is doing well following the laparoscopic tubal ligation, hysteroscopic removal of the IUD, dilation and curettage, and Novasure endometrial ablation.   (FYI, surgery was done at 18:00 on 06/13/14 due to a communication error about the arrival time for patient to present to the hospital for her surgery.  The case had to be bumped to later in the day.)

## 2014-06-15 NOTE — Telephone Encounter (Signed)
Returning call.

## 2014-06-19 NOTE — Telephone Encounter (Signed)
Left message to call Amaziah Raisanen at [phone]. 

## 2014-06-20 NOTE — Telephone Encounter (Signed)
The patient has dissolvable sutures in the umbilical and the suprapubic incisions.  I will look at them when she comes in for her post op visit.  Continue to clean with antibacterial soap and water.

## 2014-06-20 NOTE — Telephone Encounter (Signed)
Spoke with patient. Advised of results as seen below from [place]. Patient is agreeable and verbalizes understanding. Patient asking if she has sutures in her belly button and bikini line. Denies any current problems. "I just want to know what it was. They are scabbing up and I have been trying to keep them clean." Would like to know what to expect with removal of sutures. Advised sutures are likely dissolvable and I will speak with provider regarding this and return call. Patient is agreeable.

## 2014-06-21 NOTE — Telephone Encounter (Signed)
Left message to call Kaitlyn at [phone]. 

## 2014-06-29 NOTE — Telephone Encounter (Signed)
Left message to call Rainen Vanrossum at [phone]. 

## 2014-07-10 NOTE — Telephone Encounter (Signed)
Spoke with patient. Advised of message as seen below from Dr.Silva. Patient is agreeable and verbalizes understanding.  Routing to provider for final review. Patient agreeable to disposition. Will close encounter.  

## 2014-07-17 ENCOUNTER — Ambulatory Visit: Payer: BC Managed Care – PPO | Admitting: Obstetrics and Gynecology

## 2014-07-21 ENCOUNTER — Ambulatory Visit (INDEPENDENT_AMBULATORY_CARE_PROVIDER_SITE_OTHER): Payer: BC Managed Care – PPO | Admitting: Obstetrics and Gynecology

## 2014-07-21 ENCOUNTER — Encounter: Payer: Self-pay | Admitting: Obstetrics and Gynecology

## 2014-07-21 VITALS — BP 100/66 | HR 68 | Temp 98.9°F | Resp 16 | Ht 67.0 in | Wt 170.0 lb

## 2014-07-21 DIAGNOSIS — N812 Incomplete uterovaginal prolapse: Secondary | ICD-10-CM

## 2014-07-21 DIAGNOSIS — R32 Unspecified urinary incontinence: Secondary | ICD-10-CM

## 2014-07-21 DIAGNOSIS — Z9889 Other specified postprocedural states: Secondary | ICD-10-CM

## 2014-07-21 NOTE — Patient Instructions (Signed)
Call if you would like to see a physical therapist for your bladder.  I have provided handouts to you about pelvic organ prolapse and urinary incontinence.

## 2014-07-21 NOTE — Progress Notes (Signed)
GYNECOLOGY  VISIT   HPI: 49 y.o.   Divorced  Caucasian  female   [phone] with Patient's last menstrual period was 07/18/2014.   here for  1 Month Post Op - 06/13/14  HYSTEROSCOPY WITH NOVASURE ABLATION and removal of malpositioned IUD (N/A Vagina )   LAPAROSCOPIC BILATERAL TUBAL LIGATION WITH CAUTERY (N/A )   DILATATION AND CURETTAGE (N/A Vagina )     One day of fever post op day 2.  Temp 100.1.  Took Ibuprofen, and this resolved. Allergy to Percocet - itching and did not sleep well. Discontinued this.   Endometrial curettings benign.  Pelvic organ prolapse noted with exam under anesthesia.  Some urinary incontinence esp. With sneezing.   Menses in June. Using a panty liner.  No pain.  No premenstrual headache.  Spotting 3 weeks ago.    Asking about doing colonoscopy early since she has almost met her deductible.  GYNECOLOGIC HISTORY: Patient's last menstrual period was 07/18/2014. Contraception: Tubal Ligation  Menopausal hormone therapy: None Last mammogram: 12/07/13 BIRADS1:neg Last pap smear: 08/30/12 neg. HR HPV:neg        OB History    Gravida Para Term Preterm AB TAB SAB Ectopic Multiple Living   _0 Patient Active Problem List   Diagnosis Date Noted  . Chest heaviness 08/30/2013    Past Medical History  Diagnosis Date  . Enlarged thyroid     being monitored yearly  . Multiple thyroid nodules     being monitored yearly  . Encounter for insertion of mirena IUD 08/04/08  . SVD (spontaneous vaginal delivery)     x 2  . Seasonal allergies   . Anemia   . Depression 2004    History - situational, no meds    Past Surgical History  Procedure Laterality Date  . Thyroid biopsy  10/11    hyplerplastic thyroid nodule- multinodular goiter  . Appendectomy  1988  . Colposcopy  1994    ASCUS  . Wisdom tooth extraction    . Hysteroscopy N/A 06/13/2014    Procedure: HYSTEROSCOPY WITH NOVASURE ABLATION and removal of malpositioned IUD ;  Surgeon:  Nunzio Cobbs, MD;  Location: [facility];  Service: Gynecology;  Laterality: N/A;  . Laparoscopic tubal ligation N/A 06/13/2014    Procedure: LAPAROSCOPIC BILATERAL TUBAL LIGATION WITH CAUTERY ;  Surgeon: Nunzio Cobbs, MD;  Location: [facility];  Service: Gynecology;  Laterality: N/A;  . Dilation and curettage of uterus N/A 06/13/2014    Procedure: DILATATION AND CURETTAGE;  Surgeon: Nunzio Cobbs, MD;  Location: [facility];  Service: Gynecology;  Laterality: N/A;    Current Outpatient Prescriptions  Medication Sig Dispense Refill  . ibuprofen (ADVIL,MOTRIN) 800 MG tablet Take 1 tablet (800 mg total) by mouth every 8 (eight) hours as needed. 30 tablet 0  . loratadine (CLARITIN) 10 MG tablet Take 10 mg by mouth daily as needed for allergies.     No current facility-administered medications for this visit.     ALLERGIES: Sulfur; Mupirocin; and Percocet  Family History  Problem Relation Age of Onset  . Hypertension Mother   . Hypertension Father     History   Social History  . Marital Status: Divorced    Spouse Name: N/A  . Number of Children: N/A  . Years of Education: N/A   Occupational History  . Not on file.  Social History Main Topics  . Smoking status: Never Smoker   . Smokeless tobacco: Never Used  . Alcohol Use: 2.0 oz/week    4 Standard drinks or equivalent per week     Comment: socailly  . Drug Use: No  . Sexual Activity:    Partners: Male    Birth Control/ Protection: Surgical     Comment: BTL   Other Topics Concern  . Not on file   Social History Narrative    ROS:  Pertinent items are noted in HPI.  PHYSICAL EXAMINATION:    BP 100/66 mmHg  Pulse 68  Temp(Src) 98.9 F (37.2 C) (Oral)  Resp 16  Ht _0  (1.702 m)  Wt 170 lb (77.111 kg)  BMI 26.62 kg/m2  LMP 07/18/2014    General appearance: alert, cooperative and appears stated age   Abdomen: incisions intact, soft, non-tender; bowel sounds normal; no masses,  no  organomegaly   Pelvic: External genitalia:  no lesions              Urethra:  normal appearing urethra with no masses, tenderness or lesions              Bartholins and Skenes: normal                 Vagina: normal appearing vagina with normal color and discharge, no lesions.  Less than first cystocele, uterine prolapse, and rectocele.              Cervix: no lesions            Bimanual Exam:  Uterus:  normal size, contour, position, consistency, mobility, non-tender              Adnexa: normal adnexa and no mass, fullness, tenderness                Chaperone was present for exam.  ASSESSMENT  Status post laparoscopic BTL., hysteroscopy with IUD removal, dilation and curettage, Novasure endometrial ablation.  Incomplete uterovaginal prolapse.  Genuine stress incontinence.  PLAN  Counseled regarding surgical findings of prolapse and incontinence.  ACOG handouts to patient. Discussed physical therapy, pessary use, and briefly mentioned surgery.  Will consider and let me know if she would like to pursue any further treatments other then Kegel exercises. Keep annual exam appointments.   Patient will call her insurance company to ask if they will cover her colonoscopy at the current time.    An After Visit Summary was printed and given to the patient.

## 2014-08-31 ENCOUNTER — Ambulatory Visit: Payer: BC Managed Care – PPO | Admitting: Obstetrics and Gynecology

## 2014-10-16 ENCOUNTER — Other Ambulatory Visit: Payer: Self-pay | Admitting: Endocrinology

## 2014-10-16 DIAGNOSIS — E041 Nontoxic single thyroid nodule: Secondary | ICD-10-CM

## 2014-10-20 ENCOUNTER — Ambulatory Visit (INDEPENDENT_AMBULATORY_CARE_PROVIDER_SITE_OTHER): Payer: BC Managed Care – PPO | Admitting: Obstetrics and Gynecology

## 2014-10-20 ENCOUNTER — Encounter: Payer: Self-pay | Admitting: Obstetrics and Gynecology

## 2014-10-20 VITALS — BP 110/70 | HR 76 | Resp 16 | Ht 66.5 in | Wt 160.0 lb

## 2014-10-20 DIAGNOSIS — Z01419 Encounter for gynecological examination (general) (routine) without abnormal findings: Secondary | ICD-10-CM

## 2014-10-20 NOTE — Patient Instructions (Signed)

## 2014-10-20 NOTE — Progress Notes (Signed)
49 y.o. G61P2012 Divorced Caucasian female here for annual exam.    Status post Examination under anesthesia, laparoscopic bilateral tubal ligation with bipolar cautery, hysteroscopic removal of Mirena IUD, dilation and curettage, NovaSure endometrial ablation 06/13/14  Skipping cycles.  They are light.  Feels like she is doing better menses - wise.  No pain with cycles.   Some leakage of urine if coughs. Does Kegel's which are helping.   Will do ultrasound next year to check her nodes around her thyroid.  Dr. Chalmers Cater following.  PCP:   Kathyrn Lass   Patient's last menstrual period was 09/18/2014.          Sexually active: No.  The current method of family planning is tubal ligation.    Exercising: Yes.    Walking Smoker:  no  Health Maintenance: Pap:  08/30/12 Neg. HR HPV:neg History of abnormal Pap:  Yes, ASCUS 1994. Colpo and cryotherapy to cervix  MMG:  12/07/13 BIRADS1:Neg Colonoscopy:  Never BMD:   Never  TDaP:  2008 Screening Labs:  Hb today: ?, Urine today:    reports that she has never smoked. She has never used smokeless tobacco. She reports that she drinks about 2.0 oz of alcohol per week. She reports that she does not use illicit drugs.  Past Medical History  Diagnosis Date  . Enlarged thyroid     being monitored yearly  . Multiple thyroid nodules     being monitored yearly  . Encounter for insertion of mirena IUD 08/04/08  . SVD (spontaneous vaginal delivery)     x 2  . Seasonal allergies   . Anemia   . Depression 2004    History - situational, no meds    Past Surgical History  Procedure Laterality Date  . Thyroid biopsy  10/11    hyplerplastic thyroid nodule- multinodular goiter  . Appendectomy  1988  . Colposcopy  1994    ASCUS  . Wisdom tooth extraction    . Hysteroscopy N/A 06/13/2014    Procedure: HYSTEROSCOPY WITH NOVASURE ABLATION and removal of malpositioned IUD ;  Surgeon: Nunzio Cobbs, MD;  Location: [facility];  Service: Gynecology;   Laterality: N/A;  . Laparoscopic tubal ligation N/A 06/13/2014    Procedure: LAPAROSCOPIC BILATERAL TUBAL LIGATION WITH CAUTERY ;  Surgeon: Nunzio Cobbs, MD;  Location: [facility];  Service: Gynecology;  Laterality: N/A;  . Dilation and curettage of uterus N/A 06/13/2014    Procedure: DILATATION AND CURETTAGE;  Surgeon: Nunzio Cobbs, MD;  Location: [facility];  Service: Gynecology;  Laterality: N/A;    Current Outpatient Prescriptions  Medication Sig Dispense Refill  . ibuprofen (ADVIL,MOTRIN) 800 MG tablet Take 1 tablet (800 mg total) by mouth every 8 (eight) hours as needed. 30 tablet 0  . loratadine (CLARITIN) 10 MG tablet Take 10 mg by mouth daily as needed for allergies.     No current facility-administered medications for this visit.    Family History  Problem Relation Age of Onset  . Hypertension Mother   . Hypertension Father     ROS:  Pertinent items are noted in HPI.  Otherwise, a comprehensive ROS was negative.  Exam:   BP 110/70 mmHg  Pulse 76  Resp 16  Ht 5' 6.5" (1.689 m)  Wt 160 lb (72.576 kg)  BMI 25.44 kg/m2  LMP 09/18/2014    General appearance: alert, cooperative and appears stated age Head: Normocephalic, without obvious abnormality, atraumatic Neck: no adenopathy, supple,  symmetrical, trachea midline and thyroid enlargement of right thyroid. Lungs: clear to auscultation bilaterally Breasts: normal appearance, no masses or tenderness, Inspection negative, No nipple retraction or dimpling, No nipple discharge or bleeding, No axillary or supraclavicular adenopathy Heart: regular rate and rhythm Abdomen: soft, non-tender; bowel sounds normal; no masses,  no organomegaly Extremities: extremities normal, atraumatic, no cyanosis or edema Skin: Skin color, texture, turgor normal. No rashes or lesions Lymph nodes: Cervical, supraclavicular, and axillary nodes normal. No abnormal inguinal nodes palpated Neurologic: Grossly normal  Pelvic: External  genitalia:  no lesions              Urethra:  normal appearing urethra with no masses, tenderness or lesions              Bartholins and Skenes: normal                 Vagina: normal appearing vagina with normal color and discharge, no lesions.  Mild cystocele, uterine prolapse, and rectocele.               Cervix: no lesions              Pap taken: No. Bimanual Exam:  Uterus:  normal size, contour, position, consistency, mobility, non-tender              Adnexa: normal adnexa and no mass, fullness, tenderness              Rectovaginal: Yes.  .  Confirms.              Anus:  normal sphincter tone, no lesions  Chaperone was present for exam.  Assessment:   Well woman visit with normal exam. Status post BTL and endometrial ablation.  Remote hx of cryotherapy to cervix. Mild pelvic organ prolapse and genuine stress incontinence.  Right thyroid nodule versus node.  Followed by Dr. Chalmers Cater.   Plan: Yearly mammogram recommended after age 8.  Recommended self breast exam.  Pap and HR HPV as above. Discussed Calcium, Vitamin D, regular exercise program including cardiovascular and weight bearing exercise. Labs performed.  No..    Refills given on medications.  No..  Kegel's.   Follow up annually and prn.     After visit summary provided.

## 2014-12-13 ENCOUNTER — Telehealth: Payer: Self-pay | Admitting: Obstetrics and Gynecology

## 2014-12-13 NOTE — Telephone Encounter (Signed)
Patient has some questions regarding a procedure she had done in May 2016

## 2014-12-14 NOTE — Telephone Encounter (Signed)
Message left to return call to Chelsea [name] at [phone].    

## 2014-12-14 NOTE — Telephone Encounter (Signed)
Patient returned call. She is completing insurance paperwork and forms for work. Needs name and dates of procedure from May 2016. Information given regarding procedure and reviewed path results. Advised she can also pull path results and information from mychart if necessary. Patient verbalized understanding. Will follow up prn. Closing encounter.

## 2014-12-18 ENCOUNTER — Other Ambulatory Visit: Payer: Self-pay

## 2014-12-18 ENCOUNTER — Telehealth: Payer: Self-pay | Admitting: Obstetrics and Gynecology

## 2014-12-18 DIAGNOSIS — Z1231 Encounter for screening mammogram for malignant neoplasm of breast: Secondary | ICD-10-CM

## 2014-12-18 NOTE — Telephone Encounter (Signed)
Patient wants to talk with the nurse. She states she wants to go over information in her chart.

## 2014-12-18 NOTE — Telephone Encounter (Signed)
Spoke with patient. She requests to know last dates of any breast imaging or Pelvic ultrasound completed, she is trying to complete annual wellness forms for health insurance.  Advised of last Pelvic ultrasound here in our office with Dr. Quincy Simmonds 09/29/13.  Advised of last mammogram 12/07/13 at [facility].  Patient agreeable.  Routing to provider for final review. Patient agreeable to disposition. Will close encounter.

## 2015-02-23 ENCOUNTER — Telehealth: Payer: Self-pay | Admitting: Obstetrics and Gynecology

## 2015-02-23 NOTE — Telephone Encounter (Signed)
Patient reports intermittent L Breast Pain that started earlier this week. Today she states pain is more sharp and pinching feelings. No skin changes.  Office visit with Dr. Quincy Simmonds recommended for breast check and possible scheduling of diagnostic breast imaging if necessary. Patient had normal screening mammogram at [facility] on 01/02/15. Office visit scheduled for 02/26/15 at 1430.  Patient agreeable. She will try Motrin for pain as needed.   Routing to provider for final review. Patient agreeable to disposition. Will close encounter.

## 2015-02-23 NOTE — Telephone Encounter (Signed)
Patient is having sharp pain in left breast for some time. Patient is asking for advice.

## 2015-02-26 ENCOUNTER — Encounter: Payer: Self-pay | Admitting: Obstetrics and Gynecology

## 2015-02-26 ENCOUNTER — Ambulatory Visit (INDEPENDENT_AMBULATORY_CARE_PROVIDER_SITE_OTHER): Payer: BC Managed Care – PPO | Admitting: Obstetrics and Gynecology

## 2015-02-26 VITALS — BP 110/70 | HR 60 | Ht 66.5 in | Wt 156.0 lb

## 2015-02-26 DIAGNOSIS — N644 Mastodynia: Secondary | ICD-10-CM

## 2015-02-26 NOTE — Progress Notes (Signed)
Patient ID: Chelsea [name], female   DOB: [birth year], 50 y.o.   MRN: [id number] GYNECOLOGY  VISIT   HPI: 50 y.o.   Divorced  Caucasian  female   [phone] with Patient's last menstrual period was 02/26/2015 (exact date).   here for left breast pain and tenderness for 1 week.  She has sharp shooting pain and "pinching" sensations in left breast.  She does not feel any masses.    Carrying her new grandbaby on her left side.  If rolls over on left, feels pains there also.  Feels like a bruise but sharp pain is gone.  Carries purse on her left or her school back on her left.  No insect bites.  Some itching on the medial side.   Has a new grand-daughter, Chelsea [name], born 6 weeks ago. Daughter, son in law, and grand-daughter living with patient.  Prior LMP 01/31/14.  GYNECOLOGIC HISTORY: Patient's last menstrual period was 02/26/2015 (exact date). Contraception:Tubal Menopausal hormone therapy: n/a Last mammogram: 01-02-15 Density Cat.C/Neg/BiRads2/screening 60yr:[facility] Last pap smear: 08-30-12 Neg:Neg HR HPV        OB History    Gravida Para Term Preterm AB TAB SAB Ectopic Multiple Living   3 2 2  1     2          Patient Active Problem List   Diagnosis Date Noted  . Chest heaviness 08/30/2013    Past Medical History  Diagnosis Date  . Enlarged thyroid     being monitored yearly  . Multiple thyroid nodules     being monitored yearly  . Encounter for insertion of mirena IUD 08/04/08  . SVD (spontaneous vaginal delivery)     x 2  . Seasonal allergies   . Anemia   . Depression 2004    History - situational, no meds    Past Surgical History  Procedure Laterality Date  . Thyroid biopsy  10/11    hyplerplastic thyroid nodule- multinodular goiter  . Appendectomy  1988  . Colposcopy  1994    ASCUS  . Wisdom tooth extraction    . Hysteroscopy N/A 06/13/2014    Procedure: HYSTEROSCOPY WITH NOVASURE ABLATION and removal of malpositioned IUD ;  Surgeon: Nunzio Cobbs, MD;   Location: [facility];  Service: Gynecology;  Laterality: N/A;  . Laparoscopic tubal ligation N/A 06/13/2014    Procedure: LAPAROSCOPIC BILATERAL TUBAL LIGATION WITH CAUTERY ;  Surgeon: Nunzio Cobbs, MD;  Location: [facility];  Service: Gynecology;  Laterality: N/A;  . Dilation and curettage of uterus N/A 06/13/2014    Procedure: DILATATION AND CURETTAGE;  Surgeon: Nunzio Cobbs, MD;  Location: [facility];  Service: Gynecology;  Laterality: N/A;  . Tubal ligation  06-13-14    Current Outpatient Prescriptions  Medication Sig Dispense Refill  . ibuprofen (ADVIL,MOTRIN) 800 MG tablet Take 1 tablet (800 mg total) by mouth every 8 (eight) hours as needed. 30 tablet 0  . loratadine (CLARITIN) 10 MG tablet Take 10 mg by mouth daily as needed for allergies.    . methocarbamol (ROBAXIN) 750 MG tablet Take 750 mg by mouth 2 (two) times daily. Reported on 02/26/2015  1  . naproxen (NAPROSYN) 500 MG tablet Take 500 mg by mouth 2 (two) times daily with a meal. Reported on 02/26/2015  2   No current facility-administered medications for this visit.     ALLERGIES: Sulfur; Mupirocin; and Percocet  Family History  Problem Relation Age of Onset  . Hypertension  Mother   . Hypertension Father     Social History   Social History  . Marital Status: Divorced    Spouse Name: N/A  . Number of Children: N/A  . Years of Education: N/A   Occupational History  . Not on file.   Social History Main Topics  . Smoking status: Never Smoker   . Smokeless tobacco: Never Used  . Alcohol Use: 2.4 oz/week    4 Standard drinks or equivalent per week     Comment: socailly  . Drug Use: No  . Sexual Activity:    Partners: Male    Birth Control/ Protection: Surgical     Comment: BTL   Other Topics Concern  . Not on file   Social History Narrative    ROS:  Pertinent items are noted in HPI.  PHYSICAL EXAMINATION:    BP 110/70 mmHg  Pulse 60  Ht 5' 6.5" (1.689 m)  Wt 156 lb (70.761 kg)  BMI  24.80 kg/m2  LMP 02/26/2015 (Exact Date)    General appearance: alert, cooperative and appears stated age   Breasts: Inspection negative, No nipple retraction or dimpling, No nipple discharge or bleeding, No axillary or supraclavicular adenopathy, right breast nontender.  Left breast tender along lateral margin. No masses in either breast.   Minor excoriation of the left medial breast.  No erythema.   Chaperone was present for exam.  ASSESSMENT  Left breast pain.  Possible musculoskeletal in origin.  Current menses.  PLAN  Counseled regarding breast pain.  Motrin or naprosyn course for 72 hours.  Heat to breast.  OK to try vit E 400 - 600 IU daily.  Return for persistence of pain.  Will then do diagnostic mammogram and ultrasound.  Patient declines this today.    An After Visit Summary was printed and given to the patient.  __15____ minutes face to face time of which over 50% was spent in counseling.

## 2015-10-04 ENCOUNTER — Ambulatory Visit
Admission: RE | Admit: 2015-10-04 | Discharge: 2015-10-04 | Disposition: A | Discharge status: Home / Self Care | Payer: BC Managed Care – PPO | Source: Ambulatory Visit | Attending: Endocrinology | Admitting: Endocrinology

## 2015-10-04 DIAGNOSIS — E041 Nontoxic single thyroid nodule: Secondary | ICD-10-CM

## 2015-10-19 ENCOUNTER — Other Ambulatory Visit: Payer: Self-pay | Admitting: Endocrinology

## 2015-10-19 DIAGNOSIS — E041 Nontoxic single thyroid nodule: Secondary | ICD-10-CM

## 2015-10-25 ENCOUNTER — Other Ambulatory Visit (HOSPITAL_COMMUNITY)
Admission: RE | Admit: 2015-10-25 | Discharge: 2015-10-25 | Disposition: A | Discharge status: Home / Self Care | Payer: BC Managed Care – PPO | Source: Ambulatory Visit | Attending: General Surgery | Admitting: General Surgery

## 2015-10-25 ENCOUNTER — Ambulatory Visit
Admission: RE | Admit: 2015-10-25 | Discharge: 2015-10-25 | Disposition: A | Discharge status: Home / Self Care | Payer: BC Managed Care – PPO | Source: Ambulatory Visit | Attending: Endocrinology | Admitting: Endocrinology

## 2015-10-25 DIAGNOSIS — D34 Benign neoplasm of thyroid gland: Secondary | ICD-10-CM | POA: Diagnosis not present

## 2015-10-25 DIAGNOSIS — E041 Nontoxic single thyroid nodule: Secondary | ICD-10-CM | POA: Diagnosis present

## 2015-10-29 ENCOUNTER — Encounter: Payer: Self-pay | Admitting: Obstetrics and Gynecology

## 2015-10-29 ENCOUNTER — Ambulatory Visit (INDEPENDENT_AMBULATORY_CARE_PROVIDER_SITE_OTHER): Payer: BC Managed Care – PPO | Admitting: Obstetrics and Gynecology

## 2015-10-29 VITALS — BP 110/80 | HR 66 | Resp 12 | Ht 65.5 in | Wt 164.0 lb

## 2015-10-29 DIAGNOSIS — Z01419 Encounter for gynecological examination (general) (routine) without abnormal findings: Secondary | ICD-10-CM

## 2015-10-29 DIAGNOSIS — Z0000 Encounter for general adult medical examination without abnormal findings: Secondary | ICD-10-CM | POA: Diagnosis not present

## 2015-10-29 DIAGNOSIS — Z1211 Encounter for screening for malignant neoplasm of colon: Secondary | ICD-10-CM

## 2015-10-29 LAB — COMPREHENSIVE METABOLIC PANEL
ALT: 16 U/L (ref 6–29)
AST: 14 U/L (ref 10–35)
Albumin: 3.9 g/dL (ref 3.6–5.1)
Alkaline Phosphatase: 38 U/L (ref 33–130)
BUN: 11 mg/dL (ref 7–25)
CALCIUM: 8.6 mg/dL (ref 8.6–10.4)
CO2: 27 mmol/L (ref 20–31)
Chloride: 104 mmol/L (ref 98–110)
Creat: 0.78 mg/dL (ref 0.50–1.05)
Glucose, Bld: 80 mg/dL (ref 65–99)
POTASSIUM: 3.8 mmol/L (ref 3.5–5.3)
Sodium: 136 mmol/L (ref 135–146)
Total Bilirubin: 0.2 mg/dL (ref 0.2–1.2)
Total Protein: 5.9 g/dL — ABNORMAL LOW (ref 6.1–8.1)

## 2015-10-29 LAB — CBC
HEMATOCRIT: 37.6 % (ref 35.0–45.0)
Hemoglobin: 12.5 g/dL (ref 11.7–15.5)
MCH: 31.8 pg (ref 27.0–33.0)
MCHC: 33.2 g/dL (ref 32.0–36.0)
MCV: 95.7 fL (ref 80.0–100.0)
MPV: 8.9 fL (ref 7.5–12.5)
PLATELETS: 301 10*3/uL (ref 140–400)
RBC: 3.93 MIL/uL (ref 3.80–5.10)
RDW: 12 % (ref 11.0–15.0)
WBC: 5.7 10*3/uL (ref 3.8–10.8)

## 2015-10-29 LAB — LIPID PANEL
Cholesterol: 130 mg/dL (ref 125–200)
HDL: 43 mg/dL — AB (ref >=46)
LDL Cholesterol: 59 mg/dL (ref <130)
Total CHOL/HDL Ratio: 3 Ratio (ref <=5.0)
Triglycerides: 138 mg/dL (ref <150)
VLDL: 28 mg/dL (ref <30)

## 2015-10-29 LAB — POCT URINALYSIS DIPSTICK
Bilirubin, UA: NEGATIVE
Blood, UA: NEGATIVE
GLUCOSE UA: NEGATIVE
KETONES UA: NEGATIVE
LEUKOCYTES UA: NEGATIVE
Nitrite, UA: NEGATIVE
Protein, UA: NEGATIVE
Urobilinogen, UA: NEGATIVE
pH, UA: 6

## 2015-10-29 NOTE — Patient Instructions (Signed)

## 2015-10-29 NOTE — Progress Notes (Signed)
49 y.o. G63P2012 Divorced Caucasian female here for annual exam.    Wants labs here.   Had a thyroid biopsy.  Done at [facility].  Benign result.   Menses are regular.  Bleeding is much less overall.  Using panty liner.  No hot flashes or night sweats.   Has mild stress incontinence.   Generally does not eat a lot of dairy products of like taking vitamins.   Daughter, son-in-law, and grand daughter living at home with her.  PCP:  Dr. Kathyrn Lass   Patient's last menstrual period was 10/17/2015.     Period Cycle (Days): 30 Period Duration (Days): 2-3 days Period Pattern: Regular Menstrual Flow: Light Menstrual Control: Panty liner Dysmenorrhea: None     Sexually active: No.  The current method of family planning is tubal ligation.    Exercising: Yes.    Walking, Running Smoker:  no  Health Maintenance: Pap:  08/30/12 Neg. HR HPV:neg  History of abnormal Pap:  Yes,  ASCUS 1994. Colpo and cryotherapy to cervix  MMG:  01-02-15 Density C/Neg/BiRads2:[facility] Colonoscopy: Not yet BMD:   n/a  Result  n/a TDaP:  2008 Flu vaccine:  Done last week.  Gardasil:   N/A   Screening Labs: Labs drawn today Hb today: n/a, Urine today: Neg   reports that she has never smoked. She has never used smokeless tobacco. She reports that she drinks about 2.4 oz of alcohol per week . She reports that she does not use drugs.  Past Medical History:  Diagnosis Date  . Anemia   . Depression 2004   History - situational, no meds  . Encounter for insertion of mirena IUD 08/04/08  . Enlarged thyroid    being monitored yearly  . Multiple thyroid nodules    being monitored yearly  . Seasonal allergies   . SVD (spontaneous vaginal delivery)    x 2    Past Surgical History:  Procedure Laterality Date  . APPENDECTOMY  1988  . COLPOSCOPY  1994   ASCUS  . DILATION AND CURETTAGE OF UTERUS N/A 06/13/2014   Procedure: DILATATION AND CURETTAGE;  Surgeon: Nunzio Cobbs, MD;  Location:  [facility];  Service: Gynecology;  Laterality: N/A;  . HYSTEROSCOPY N/A 06/13/2014   Procedure: HYSTEROSCOPY WITH NOVASURE ABLATION and removal of malpositioned IUD ;  Surgeon: Nunzio Cobbs, MD;  Location: [facility];  Service: Gynecology;  Laterality: N/A;  . LAPAROSCOPIC TUBAL LIGATION N/A 06/13/2014   Procedure: LAPAROSCOPIC BILATERAL TUBAL LIGATION WITH CAUTERY ;  Surgeon: Nunzio Cobbs, MD;  Location: [facility];  Service: Gynecology;  Laterality: N/A;  . thyroid biopsy  10/11   hyplerplastic thyroid nodule- multinodular goiter  . TUBAL LIGATION  06-13-14  . WISDOM TOOTH EXTRACTION      Current Outpatient Prescriptions  Medication Sig Dispense Refill  . ibuprofen (ADVIL,MOTRIN) 800 MG tablet Take 1 tablet (800 mg total) by mouth every 8 (eight) hours as needed. 30 tablet 0  . loratadine (CLARITIN) 10 MG tablet Take 10 mg by mouth daily as needed for allergies.    . methocarbamol (ROBAXIN) 750 MG tablet Take 750 mg by mouth 2 (two) times daily. Reported on 02/26/2015  1  . naproxen (NAPROSYN) 500 MG tablet Take 500 mg by mouth 2 (two) times daily with a meal. Reported on 02/26/2015  2   No current facility-administered medications for this visit.     Family History  Problem Relation Age of Onset  .  Hypertension Mother   . Hypertension Father     ROS:  Pertinent items are noted in HPI.  Otherwise, a comprehensive ROS was negative.  Exam:   BP 110/80 (BP Location: Right Arm, Patient Position: Sitting, Cuff Size: Normal)   Pulse 66   Resp 12   Ht 5' 5.5" (1.664 m)   Wt 164 lb (74.4 kg)   LMP 10/17/2015   BMI 26.88 kg/m     General appearance: alert, cooperative and appears stated age Head: Normocephalic, without obvious abnormality, atraumatic Neck: no adenopathy, supple, symmetrical, trachea midline and thyroid normal to inspection and palpation Lungs: clear to auscultation bilaterally Breasts: normal appearance, no masses or tenderness, No nipple retraction or  dimpling, No nipple discharge or bleeding, No axillary or supraclavicular adenopathy Heart: regular rate and rhythm Abdomen: soft, non-tender; no masses, no organomegaly Extremities: extremities normal, atraumatic, no cyanosis or edema Skin: Skin color, texture, turgor normal. No rashes or lesions Lymph nodes: Cervical, supraclavicular, and axillary nodes normal. No abnormal inguinal nodes palpated Neurologic: Grossly normal  Pelvic: External genitalia:  no lesions              Urethra:  normal appearing urethra with no masses, tenderness or lesions              Bartholins and Skenes: normal                 Vagina: normal appearing vagina with normal color and discharge, no lesions              Cervix: no lesions              Pap taken: Yes.   Bimanual Exam:  Uterus:  normal size, contour, position, consistency, mobility, non-tender.  Almost first degree cystocele and uterine prolapse.               Adnexa: no mass, fullness, tenderness              Rectal exam: Yes.  .  Confirms.              Anus:  normal sphincter tone, no lesions  Chaperone was present for exam.  Assessment:   Well woman visit with normal exam. Status post BTL and endometrial ablation.  Remote hx of cryotherapy to cervix. Mild pelvic organ prolapse and genuine stress incontinence.  Right thyroid nodule.  Benign biopsy.  Followed by Dr. Chalmers Cater.   Plan: Yearly mammogram recommended after age 52.  Recommended self breast exam.  Pap and HR HPV as above. Discussed Calcium, Vitamin D, regular exercise program including cardiovascular and weight bearing exercise. Discussed Impressa, pessary, PT, and surgery for prolapse and incontinence.  She will do observational management.   Referral for colonoscopy.  Routine labs. Follow up annually and prn.       After visit summary provided.

## 2015-10-30 ENCOUNTER — Encounter: Payer: Self-pay | Admitting: Internal Medicine

## 2015-10-30 LAB — VITAMIN D 25 HYDROXY (VIT D DEFICIENCY, FRACTURES): Vit D, 25-Hydroxy: 32 ng/mL (ref 30–100)

## 2015-11-01 ENCOUNTER — Telehealth: Payer: Self-pay

## 2015-11-01 LAB — IPS PAP TEST WITH HPV

## 2015-11-01 NOTE — Telephone Encounter (Signed)
Patient returning call. Please call after 2:30 because of school.

## 2015-11-01 NOTE — Telephone Encounter (Signed)
Spoke with patient. Advised of results seen below from [facility]. Patient is agreeable and verbalizes understanding.  Routing to provider for final review. Patient agreeable to disposition. Will close encounter.

## 2015-11-01 NOTE — Telephone Encounter (Signed)
-----   Message from Nunzio Cobbs, MD sent at 10/30/2015  7:57 AM EDT ----- Results to patient through My Chart.  Hi Chelsea [name],   Your blood work has returned.  The cholesterol panel is showing a low HDL cholesterol, the good cholesterol.  The ratios of the cholesterol are good.  You can improve this further through exercise and a low cholesterol diet.   Your blood chemistries showed a low protein level.  You can increase this by adding more poultry, fish, dairy products, and beans.  You could also do this through a protein shake.    Your blood counts and vitamin D level were normal.   You pap is still in process.   Have a great time at [facility]!  Josefa Half, MD

## 2015-11-01 NOTE — Telephone Encounter (Signed)
Returned patient's call at [phone] and left message asking her to call me back.

## 2015-11-01 NOTE — Telephone Encounter (Signed)
Called patient at [phone] to review lab results, left message to call me back.

## 2015-11-05 ENCOUNTER — Telehealth: Payer: Self-pay

## 2015-11-05 NOTE — Telephone Encounter (Signed)
-----   Message from Nunzio Cobbs, MD sent at 11/01/2015  9:12 PM EDT ----- Please report pap showing normal cells and negative high risk HPV.  The pap did show signs of potential bacterial vaginosis.  This does not require treatment.  If she is experiencing vaginal odor, burning, or discharge, I recommend treatment with either: - Metrogel 0.75% pv at hs for 5 nights. - Flagyl 500 mg po bid for 7 days. Will need ETOH precautions if she will treat.   Please enter recall - 02.

## 2015-11-05 NOTE — Telephone Encounter (Signed)
Left message to call Jaspal Pultz at [phone]. 

## 2015-11-05 NOTE — Telephone Encounter (Signed)
Spoke with patient. Results given. Patient states that at this time she does not have any symptoms. She would like to monitor and return call if she develops any symptoms. 02 recall placed.  Routing to provider for final review. Patient agreeable to disposition. Will close encounter.

## 2015-12-19 ENCOUNTER — Ambulatory Visit (AMBULATORY_SURGERY_CENTER): Payer: Self-pay

## 2015-12-19 VITALS — Ht 67.5 in | Wt 162.0 lb

## 2015-12-19 DIAGNOSIS — Z1211 Encounter for screening for malignant neoplasm of colon: Secondary | ICD-10-CM

## 2015-12-19 NOTE — Progress Notes (Signed)
No allergies to eggs or soy No past problems with anesthesia or airway No diet meds No home oxygen  Declined emmi 

## 2015-12-27 ENCOUNTER — Encounter: Payer: Self-pay | Admitting: Internal Medicine

## 2016-01-04 ENCOUNTER — Encounter: Payer: Self-pay | Admitting: Internal Medicine

## 2016-01-04 ENCOUNTER — Ambulatory Visit (AMBULATORY_SURGERY_CENTER): Payer: BC Managed Care – PPO | Admitting: Internal Medicine

## 2016-01-04 VITALS — BP 118/77 | HR 65 | Temp 98.0°F | Resp 16 | Ht 67.5 in | Wt 162.0 lb

## 2016-01-04 DIAGNOSIS — Z1211 Encounter for screening for malignant neoplasm of colon: Secondary | ICD-10-CM

## 2016-01-04 DIAGNOSIS — D123 Benign neoplasm of transverse colon: Secondary | ICD-10-CM | POA: Diagnosis not present

## 2016-01-04 DIAGNOSIS — Z1212 Encounter for screening for malignant neoplasm of rectum: Secondary | ICD-10-CM

## 2016-01-04 HISTORY — PX: COLONOSCOPY: SHX174

## 2016-01-04 MED ORDER — SODIUM CHLORIDE 0.9 % IV SOLN: 500.0000 mL | INTRAVENOUS | Status: DC

## 2016-01-04 NOTE — Progress Notes (Signed)
Called to room to assist during endoscopic procedure.  Patient ID and intended procedure confirmed with present staff. Received instructions for my participation in the procedure from the performing physician.  

## 2016-01-04 NOTE — Progress Notes (Signed)
To recovery, report to Scott, RN, VSS 

## 2016-01-04 NOTE — Patient Instructions (Addendum)
I found and removed one polyp from the colon - it looks benign.  I will let you know pathology results and when to have another routine colonoscopy by mail.  I appreciate the opportunity to care for you. Gatha Mayer, MD, FACG  YOU HAD AN ENDOSCOPIC PROCEDURE TODAY AT [facility]:   Refer to the procedure report that was given to you for any specific questions about what was found during the examination.  If the procedure report does not answer your questions, please call your gastroenterologist to clarify.  If you requested that your care partner not be given the details of your procedure findings, then the procedure report has been included in a sealed envelope for you to review at your convenience later.  YOU SHOULD EXPECT: Some feelings of bloating in the abdomen. Passage of more gas than usual.  Walking can help get rid of the air that was put into your GI tract during the procedure and reduce the bloating. If you had a lower endoscopy (such as a colonoscopy or flexible sigmoidoscopy) you may notice spotting of blood in your stool or on the toilet paper. If you underwent a bowel prep for your procedure, you may not have a normal bowel movement for a few days.  Please Note:  You might notice some irritation and congestion in your nose or some drainage.  This is from the oxygen used during your procedure.  There is no need for concern and it should clear up in a day or so.  SYMPTOMS TO REPORT IMMEDIATELY:   Following lower endoscopy (colonoscopy or flexible sigmoidoscopy):  Excessive amounts of blood in the stool  Significant tenderness or worsening of abdominal pains  Swelling of the abdomen that is new, acute  Fever of 100F or higher  For urgent or emergent issues, a gastroenterologist can be reached at any hour by calling [phone].   DIET:  We do recommend a small meal at first, but then you may proceed to your regular diet.  Drink plenty of fluids  but you should avoid alcoholic beverages for 24 hours.  ACTIVITY:  You should plan to take it easy for the rest of today and you should NOT DRIVE or use heavy machinery until tomorrow (because of the sedation medicines used during the test).    FOLLOW UP: Our staff will call the number listed on your records the next business day following your procedure to check on you and address any questions or concerns that you may have regarding the information given to you following your procedure. If we do not reach you, we will leave a message.  However, if you are feeling well and you are not experiencing any problems, there is no need to return our call.  We will assume that you have returned to your regular daily activities without incident.  If any biopsies were taken you will be contacted by phone or by letter within the next 1-3 weeks.  Please call us at [phone] if you have not heard about the biopsies in 3 weeks.    SIGNATURES/CONFIDENTIALITY: You and/or your care partner have signed paperwork which will be entered into your electronic medical record.  These signatures attest to the fact that that the information above on your After Visit Summary has been reviewed and is understood.  Full responsibility of the confidentiality of this discharge information lies with you and/or your care-partner.   Polyp information given.  No aspirin, ibuprofen, naproxen or  other non steroidal anti inflammatory meds for 2 weeks.

## 2016-01-04 NOTE — Op Note (Signed)
[facility] Patient Name: Chelsea [name] Procedure Date: 01/04/2016 9:22 AM MRN: [id number] Endoscopist: Gatha Mayer , MD Age: 50 Referring MD:  Date of Birth: 02-Aug-1965 Gender: Female Account #: [id number] Procedure:                Colonoscopy Indications:              Screening for colorectal malignant neoplasm, This                            is the patient's first colonoscopy Medicines:                Propofol per Anesthesia, Monitored Anesthesia Care Procedure:                Pre-Anesthesia Assessment:                           - Prior to the procedure, a History and Physical                            was performed, and patient medications and                            allergies were reviewed. The patient's tolerance of                            previous anesthesia was also reviewed. The risks                            and benefits of the procedure and the sedation                            options and risks were discussed with the patient.                            All questions were answered, and informed consent                            was obtained. Prior Anticoagulants: The patient has                            taken no previous anticoagulant or antiplatelet                            agents. ASA Grade Assessment: I - A normal, healthy                            patient. After reviewing the risks and benefits,                            the patient was deemed in satisfactory condition to                            undergo the procedure.  After obtaining informed consent, the colonoscope                            was passed under direct vision. Throughout the                            procedure, the patient's blood pressure, pulse, and                            oxygen saturations were monitored continuously. The                            Model CF-HQ190L [id number]) scope was introduced                            through the  anus and advanced to the the cecum,                            identified by appendiceal orifice and ileocecal                            valve. The colonoscopy was performed without                            difficulty. The patient tolerated the procedure                            well. The quality of the bowel preparation was                            good. The bowel preparation used was Miralax. The                            ileocecal valve, appendiceal orifice, and rectum                            were photographed. Scope In: 9:29:11 AM Scope Out: 9:45:00 AM Scope Withdrawal Time: 0 hours 13 minutes 48 seconds  Total Procedure Duration: 0 hours 15 minutes 49 seconds  Findings:                 The perianal and digital rectal examinations were                            normal.                           A 12 mm polyp was found in the transverse colon.                            The polyp was sessile. The polyp was removed with a                            hot snare. Resection and retrieval were complete.  Verification of patient identification for the                            specimen was done. Estimated blood loss: none.                           The exam was otherwise without abnormality on                            direct and retroflexion views. Complications:            No immediate complications. Estimated Blood Loss:     Estimated blood loss: none. Impression:               - One 12 mm polyp in the transverse colon, removed                            with a hot snare. Resected and retrieved.                           - The examination was otherwise normal on direct                            and retroflexion views. Recommendation:           - Patient has a contact number available for                            emergencies. The signs and symptoms of potential                            delayed complications were discussed with the                             patient. Return to normal activities tomorrow.                            Written discharge instructions were provided to the                            patient.                           - Resume previous diet.                           - Continue present medications.                           - No aspirin, ibuprofen, naproxen, or other                            non-steroidal anti-inflammatory drugs for 2 weeks                            after polyp removal.                           -  Repeat colonoscopy is recommended. The                            colonoscopy date will be determined after pathology                            results from today's exam become available for                            review. Gatha Mayer, MD 01/04/2016 9:52:15 AM This report has been signed electronically.

## 2016-01-07 ENCOUNTER — Telehealth: Payer: Self-pay

## 2016-01-07 NOTE — Telephone Encounter (Signed)
Left message on answering machine. 

## 2016-01-09 ENCOUNTER — Telehealth: Payer: Self-pay

## 2016-01-09 NOTE — Telephone Encounter (Signed)
  Follow up Call-  Call back number 01/04/2016  Post procedure Call Back phone  # [phone]  Permission to leave phone message Yes  Some recent data might be hidden    Patient was called for follow up after her procedure on 01/04/2016. No answer at the number given for follow up phone call. A message was left on voice mail.

## 2016-01-23 ENCOUNTER — Encounter: Payer: Self-pay | Admitting: Obstetrics and Gynecology

## 2016-01-23 ENCOUNTER — Encounter: Payer: Self-pay | Admitting: Internal Medicine

## 2016-01-23 DIAGNOSIS — Z86010 Personal history of colonic polyps: Secondary | ICD-10-CM

## 2016-01-23 DIAGNOSIS — Z860101 Personal history of adenomatous and serrated colon polyps: Secondary | ICD-10-CM

## 2016-01-23 HISTORY — DX: Personal history of adenomatous and serrated colon polyps: Z86.0101

## 2016-01-23 HISTORY — DX: Personal history of colonic polyps: Z86.010

## 2016-01-23 NOTE — Progress Notes (Signed)
12 mm adenoma Recall 12/2018

## 2016-06-28 IMAGING — US US SOFT TISSUE HEAD/NECK
1 series · 14 of 25 positions shown · non-contrast
Comparison: 07/28/2012, 08/26/2010 and 08/08/2009

CLINICAL DATA: Multinodular thyroid goiter and status post prior
ultrasound-guided biopsy of a dominant 2.3 cm right thyroid nodule
in 5933.

EXAM:
THYROID ULTRASOUND
TECHNIQUE: Ultrasound examination of the thyroid gland and adjacent soft
tissues was performed.

[Series 1: us soft tissue head/neck · 0.07mm/px · 14 of 70 slices shown]
[im 1/70]
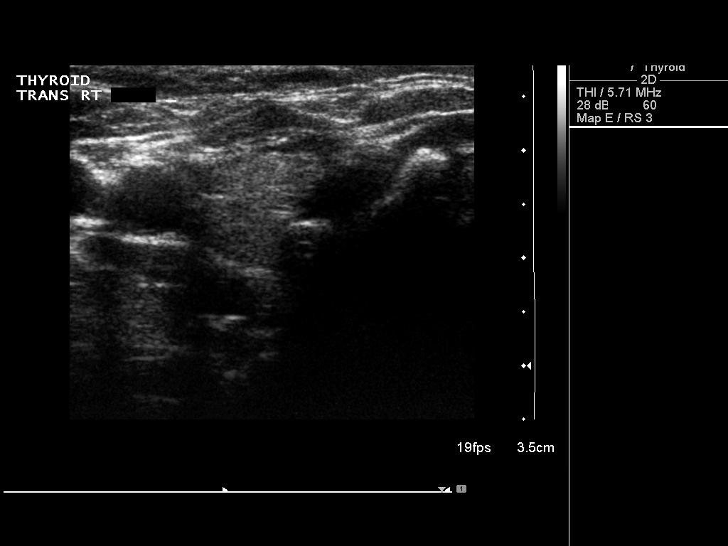
[im 6/70]
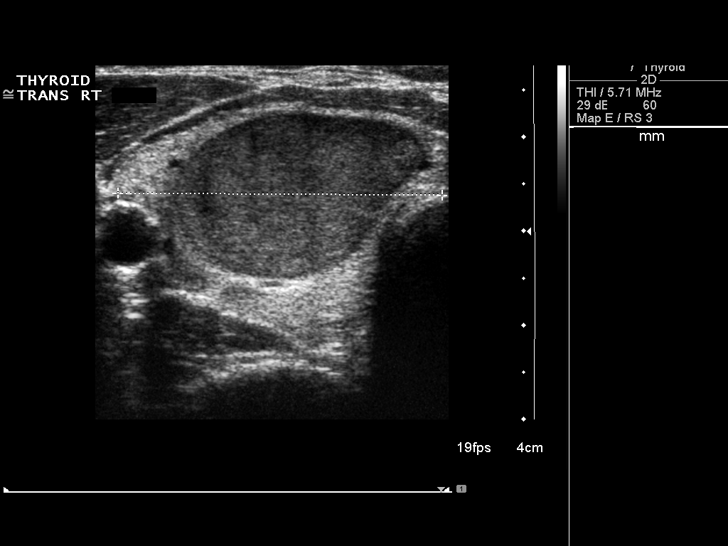
[im 12/70]
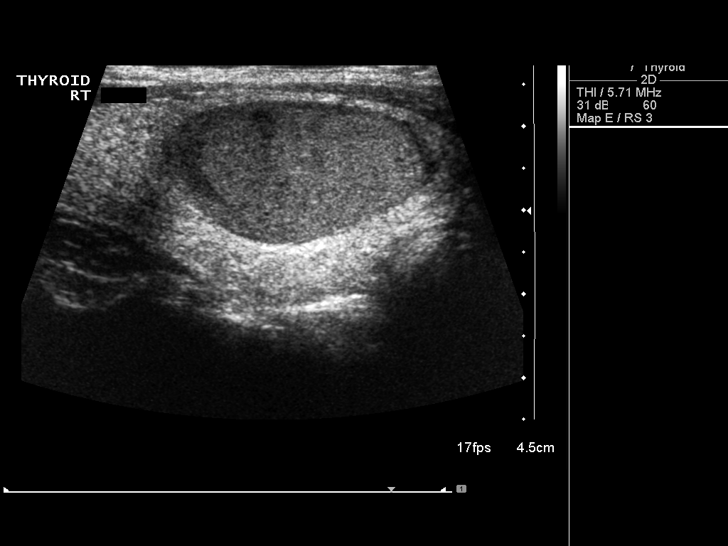
[im 18/70]
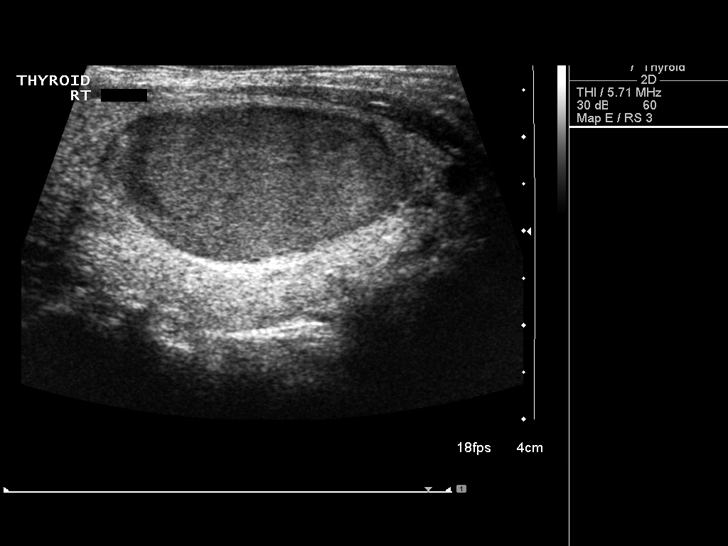
[im 24/70]
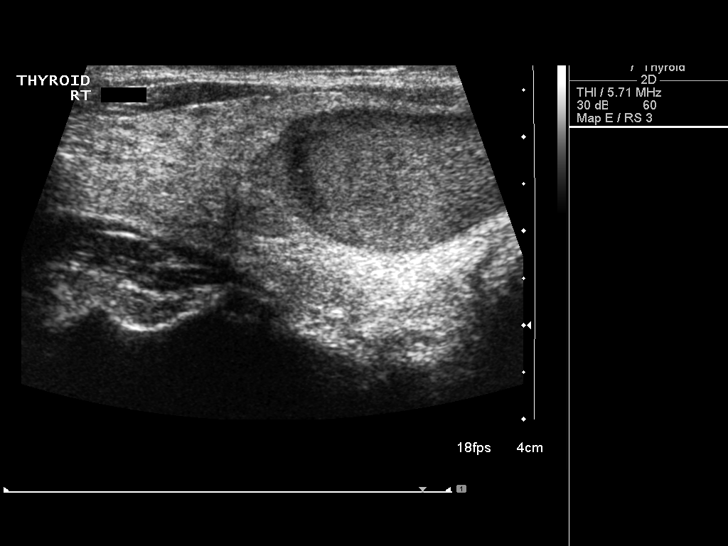
[im 26/70]
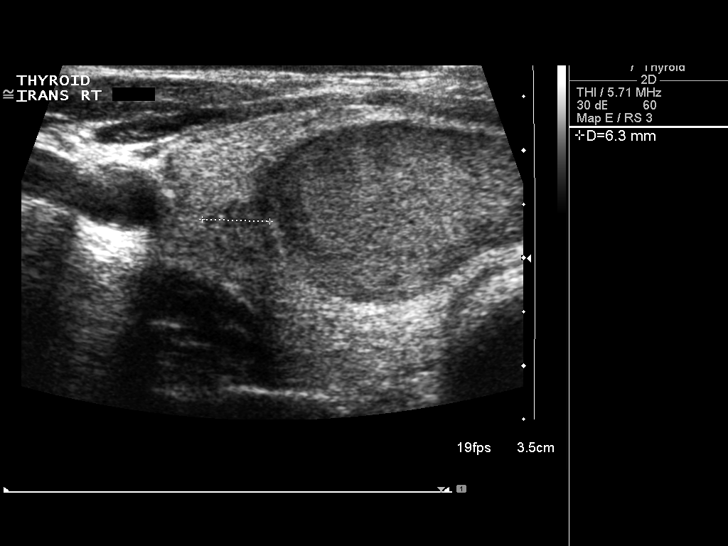
[im 32/70]
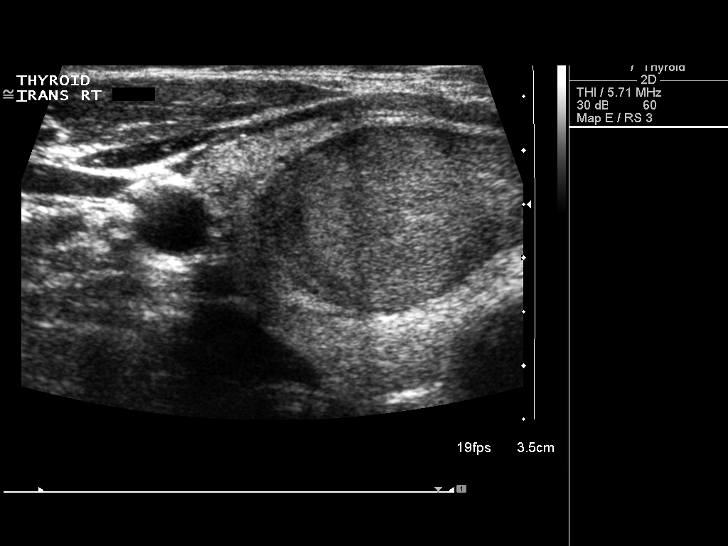
[im 38/70]
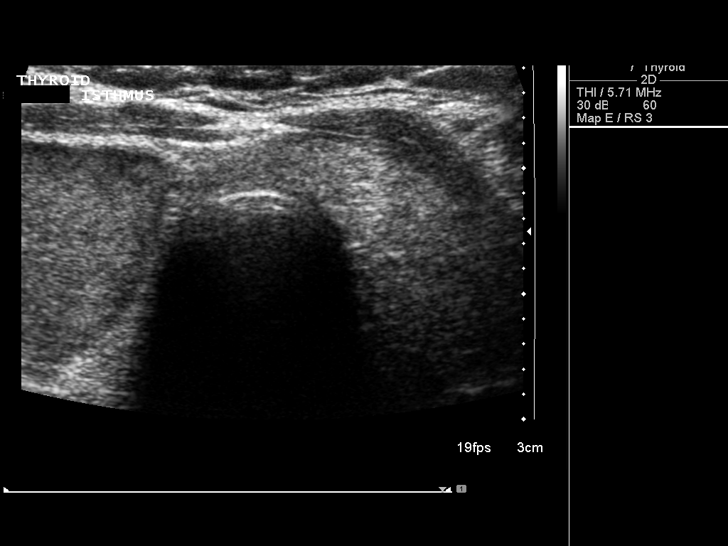
[im 44/70]
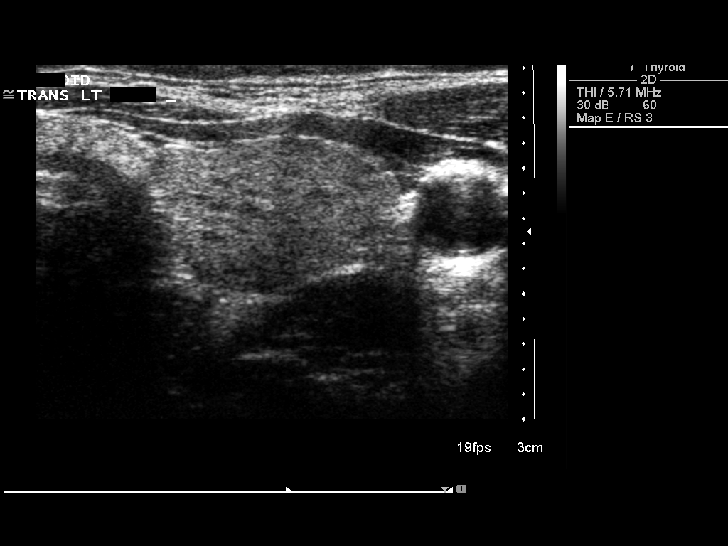
[im 47/70]
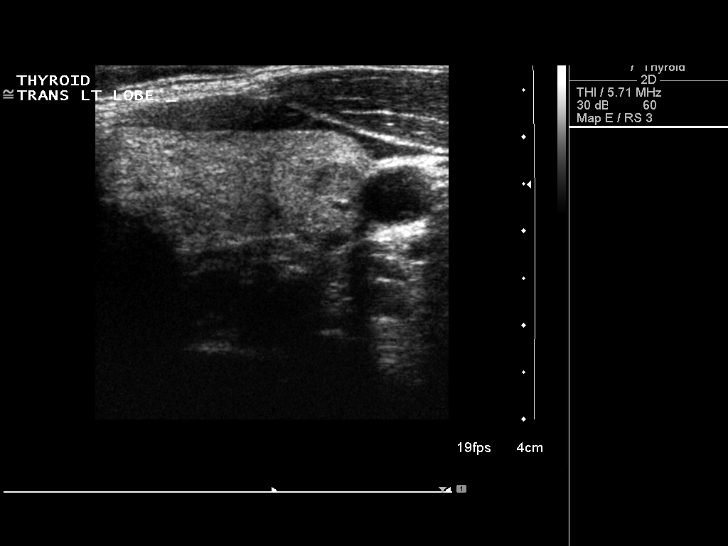
[im 52/70]
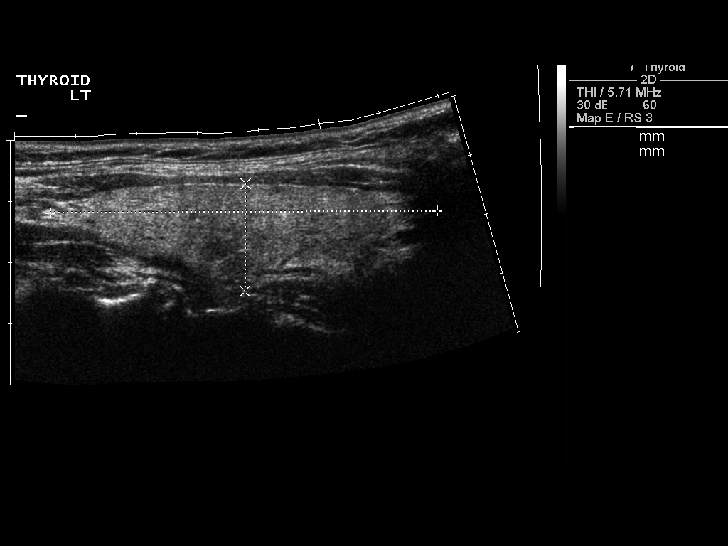
[im 58/70]
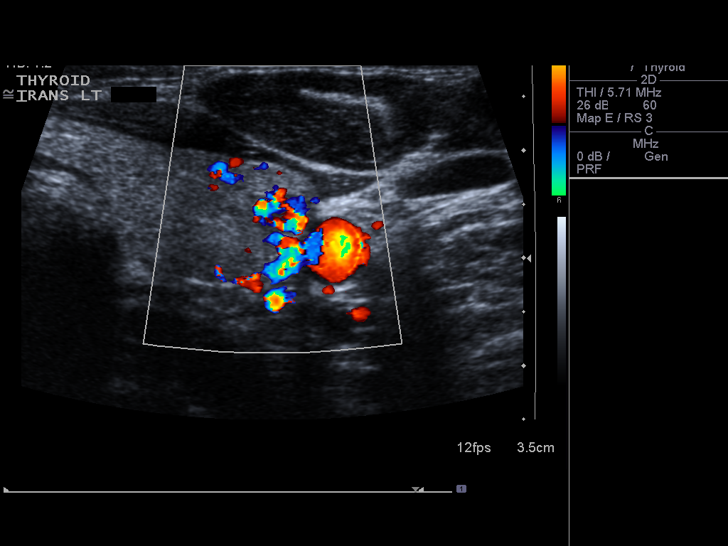
[im 64/70]
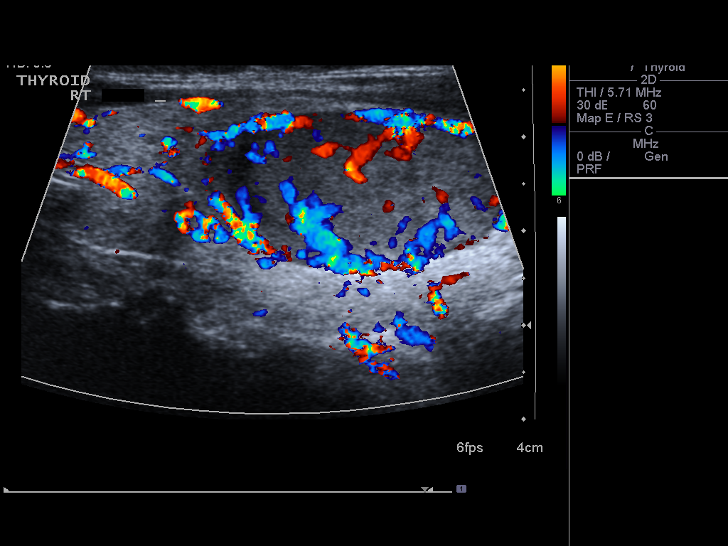
[im 70/70]
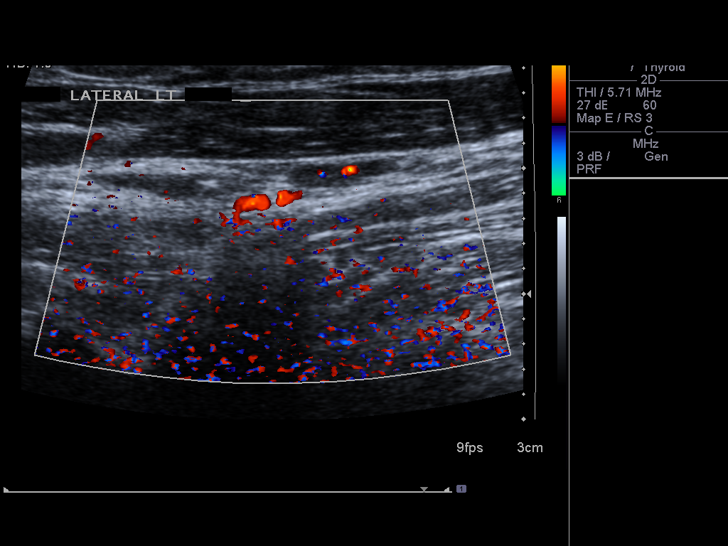

[14 of 25 positions shown; findings below may reference images not displayed]

FINDINGS: Right thyroid lobe

Measurements: 6.8 x 2.7 x 3.4 cm. The dominant well-circumscribed
solid nodule in the mid to lower aspect of the right lobe is stable
and measures 3.2 x 1.6 x 2.6 cm. Heterogeneity throughout the rest
of the right lobe is stable with no other discrete nodules
identified.

Left thyroid lobe

Measurements: 6.3 x 1.8 x 2.1 cm.  Stable 0.7 cm nodule.

Isthmus

Thickness: 0.4 cm.  No nodules visualized.

Lymphadenopathy

None visualized.
IMPRESSION: Stable dominant right thyroid nodule. Dimensions since the prior
study are stable. Since 5933, the nodule has grown with maximum
diameter of 2.3 cm in 5933. Biopsy at that time showed evidence of a
hyperplastic nodule.

## 2016-10-20 ENCOUNTER — Other Ambulatory Visit: Payer: Self-pay | Admitting: Endocrinology

## 2016-10-20 DIAGNOSIS — E041 Nontoxic single thyroid nodule: Secondary | ICD-10-CM

## 2016-10-30 ENCOUNTER — Ambulatory Visit
Admission: RE | Admit: 2016-10-30 | Discharge: 2016-10-30 | Disposition: A | Discharge status: Home / Self Care | Payer: BC Managed Care – PPO | Source: Ambulatory Visit | Attending: Endocrinology | Admitting: Endocrinology

## 2016-10-30 DIAGNOSIS — E041 Nontoxic single thyroid nodule: Secondary | ICD-10-CM

## 2016-11-03 ENCOUNTER — Ambulatory Visit (INDEPENDENT_AMBULATORY_CARE_PROVIDER_SITE_OTHER): Payer: BC Managed Care – PPO | Admitting: Obstetrics and Gynecology

## 2016-11-03 ENCOUNTER — Encounter: Payer: Self-pay | Admitting: Obstetrics and Gynecology

## 2016-11-03 VITALS — BP 100/62 | HR 60 | Resp 14 | Ht 67.0 in | Wt 169.0 lb

## 2016-11-03 DIAGNOSIS — Z01419 Encounter for gynecological examination (general) (routine) without abnormal findings: Secondary | ICD-10-CM

## 2016-11-03 DIAGNOSIS — Z23 Encounter for immunization: Secondary | ICD-10-CM | POA: Diagnosis not present

## 2016-11-03 DIAGNOSIS — Z113 Encounter for screening for infections with a predominantly sexual mode of transmission: Secondary | ICD-10-CM

## 2016-11-03 NOTE — Progress Notes (Signed)
51 y.o. G49P2012 Divorced Caucasian female here for annual exam.    Saw endocrinology and had [place] last week.  Not on thyroid medication.   Menses are regular.  No hot flashes.   Doing Kegels.  Some loss of urine of urine with one episode of playing baseball and running a base. Training for a 5K and does not leak with this.   Second grandchild on the way.   Wants STD testing.   PCP: Kathyrn Lass, MD   Endocrinology:  Dr. Soyla Murphy.   Patient's last menstrual period was 10/28/2016 (exact date).     Period Cycle (Days): 30 Period Duration (Days): 3 days Period Pattern: Regular Menstrual Flow: Light Menstrual Control: Panty liner Dysmenorrhea: None (occ. headache prior to menses)     Sexually active: Yes.    female The current method of family planning is tubal ligation.    Exercising: Yes.    walking Smoker:  no  Health Maintenance: Pap:10-29-15 Neg:Neg HR HPV,  08-30-12 Neg:Neg HR HPV History of abnormal Pap:  Yes,ASCUS 1994. Colpo and cryotherapy to cervix   MMG: 01-03-16 3D Density C/Neg/BiRads2:[facility] Colonoscopy: 01-04-16 polyp with Dr.Gessner;next due 12/2018. BMD:   n/a  Result  n/a TDaP:  2008. Gardasil:   no PXT:GGYIRS Hep C:Unsure Screening Labs:  Hb today: Labs drawn, Urine today: not done   reports that she has never smoked. She has never used smokeless tobacco. She reports that she drinks about 4.2 oz of alcohol per week . She reports that she does not use drugs.  Past Medical History:  Diagnosis Date  . Anemia   . Depression 2004   History - situational, no meds  . Encounter for insertion of mirena IUD 08/04/08  . Enlarged thyroid    being monitored yearly  . Hx of adenomatous polyp of colon 01/23/2016  . Multiple thyroid nodules    being monitored yearly  . Seasonal allergies   . Seasonal allergies   . SVD (spontaneous vaginal delivery)    x 2    Past Surgical History:  Procedure Laterality Date  . APPENDECTOMY  1988  . COLPOSCOPY  1994   ASCUS  .  DILATION AND CURETTAGE OF UTERUS N/A 06/13/2014   Procedure: DILATATION AND CURETTAGE;  Surgeon: Nunzio Cobbs, MD;  Location: [facility];  Service: Gynecology;  Laterality: N/A;  . HYSTEROSCOPY N/A 06/13/2014   Procedure: HYSTEROSCOPY WITH NOVASURE ABLATION and removal of malpositioned IUD ;  Surgeon: Nunzio Cobbs, MD;  Location: [facility];  Service: Gynecology;  Laterality: N/A;  . LAPAROSCOPIC TUBAL LIGATION N/A 06/13/2014   Procedure: LAPAROSCOPIC BILATERAL TUBAL LIGATION WITH CAUTERY ;  Surgeon: Nunzio Cobbs, MD;  Location: [facility];  Service: Gynecology;  Laterality: N/A;  . thyroid biopsy  10/11   hyplerplastic thyroid nodule- multinodular goiter  . TUBAL LIGATION  06-13-14  . WISDOM TOOTH EXTRACTION      Current Outpatient Prescriptions  Medication Sig Dispense Refill  . ibuprofen (ADVIL,MOTRIN) 800 MG tablet Take 1 tablet (800 mg total) by mouth every 8 (eight) hours as needed. 30 tablet 0  . loratadine (CLARITIN) 10 MG tablet Take 10 mg by mouth daily as needed for allergies.    . methocarbamol (ROBAXIN) 750 MG tablet Take 750 mg by mouth every 6 (six) hours as needed. Reported on 02/26/2015  1  . naproxen (NAPROSYN) 500 MG tablet Take 500 mg by mouth 2 (two) times daily as needed. Reported on 02/26/2015  2  Current Facility-Administered Medications  Medication Dose Route Frequency Provider Last Rate Last Dose  . 0.9 %  sodium chloride infusion  500 mL Intravenous Continuous Gatha Mayer, MD        Family History  Problem Relation Age of Onset  . Hypertension Mother   . Hypertension Father   . Colon cancer Neg Hx   . Rectal cancer Neg Hx     ROS:  Pertinent items are noted in HPI.  Otherwise, a comprehensive ROS was negative.  Exam:   BP 100/62 (BP Location: Right Arm, Patient Position: Sitting, Cuff Size: Normal)   Pulse 60   Resp 14   Ht 5\' 7"  (1.702 m)   Wt 169 lb (76.7 kg)   LMP 10/28/2016 (Exact Date)   BMI 26.47 kg/m     General  appearance: alert, cooperative and appears stated age Head: Normocephalic, without obvious abnormality, atraumatic Neck: no adenopathy, supple, symmetrical, trachea midline and thyroid enlarged on right and nontender. Lungs: clear to auscultation bilaterally Breasts: normal appearance, no masses or tenderness, No nipple retraction or dimpling, No nipple discharge or bleeding, No axillary or supraclavicular adenopathy Heart: regular rate and rhythm Abdomen: soft, non-tender; no masses, no organomegaly Extremities: extremities normal, atraumatic, no cyanosis or edema Skin: Skin color, texture, turgor normal. No rashes or lesions Lymph nodes: Cervical, supraclavicular, and axillary nodes normal. No abnormal inguinal nodes palpated Neurologic: Grossly normal  Pelvic: External genitalia:  no lesions              Urethra:  normal appearing urethra with no masses, tenderness or lesions              Bartholins and Skenes: normal                 Vagina: normal appearing vagina with normal color and discharge, no lesions              Cervix: no lesions              Pap taken:  No. Bimanual Exam:  Uterus:  normal size, contour, position, consistency, mobility, non-tender              Adnexa: no mass, fullness, tenderness              Rectal exam: Yes.  .  Confirms.              Anus:  normal sphincter tone, no lesions  Chaperone was present for exam.  Assessment:   Well woman visit with normal exam. STD screening.  Goiter.  Followed by Dr. Soyla Murphy.   Plan: Mammogram screening discussed. Recommended self breast awareness. Pap and HR HPV as above. Guidelines for Calcium, Vitamin D, regular exercise program including cardiovascular and weight bearing exercise. STD screening.  TDap. Follow up annually and prn.   After visit summary provided.

## 2016-11-03 NOTE — Patient Instructions (Signed)

## 2016-11-04 LAB — HEPATITIS C ANTIBODY: Hep C Virus Ab: <0.1 s/co ratio (ref 0.0–0.9)

## 2016-11-04 LAB — HEP, RPR, HIV PANEL
HEP B S AG: NEGATIVE
HIV Screen 4th Generation wRfx: NONREACTIVE
RPR Ser Ql: NONREACTIVE

## 2016-11-05 LAB — CHLAMYDIA/GONOCOCCUS/TRICHOMONAS, NAA
Chlamydia by NAA: NEGATIVE
Gonococcus by NAA: NEGATIVE
TRICH VAG BY NAA: NEGATIVE

## 2017-01-16 ENCOUNTER — Encounter: Payer: Self-pay | Admitting: Obstetrics and Gynecology

## 2017-11-09 ENCOUNTER — Ambulatory Visit: Payer: BC Managed Care – PPO | Admitting: Obstetrics and Gynecology

## 2017-11-12 NOTE — Progress Notes (Signed)
52 y.o. G70P2012 Divorced Caucasian female here for annual exam.    Concerned about STDs.  Pulled hamstring.   Has degenerative disc.  PCP: Kathyrn Lass, MD  Patient's last menstrual period was 11/09/2017 (exact date).     Period Cycle (Days): 30 Period Duration (Days): 5 days Period Pattern: Regular Menstrual Flow: (first few days heavy then spotting) Menstrual Control: Thin pad Menstrual Control Change Freq (Hours): every 12 hours on heaviest day Dysmenorrhea: (headache day prior to menses)     Sexually active: Yes.   female The current method of family planning is tubal ligation.    Exercising: No.  not currently--has pulled hamstring Smoker:  no  Health Maintenance: Pap: 10-29-15 Neg:Neg HR HPV,  08-30-12 Neg:Neg HR HPV  History of abnormal Pap:  Yes, ASCUS 1994. Colpo and cryotherapy to cervix   MMG: 01-07-17 3D/Neg/Density C/BiRads1 Colonoscopy:  01-04-16 polyp with Dr.Gessner;next due 12/2018. BMD:   n/a  Result  n/a TDaP:  11-03-16 Gardasil:   no HIV: 11-03-16 NR Hep C: 11-03-16 Neg Screening Labs:   ---   reports that she has never smoked. She has never used smokeless tobacco. She reports that she drinks about 7.0 standard drinks of alcohol per week. She reports that she does not use drugs.  Past Medical History:  Diagnosis Date  . Anemia   . Depression 2004   History - situational, no meds  . Encounter for insertion of mirena IUD 08/04/08  . Enlarged thyroid    being monitored yearly  . Hx of adenomatous polyp of colon 01/23/2016  . Multiple thyroid nodules    being monitored yearly  . Seasonal allergies   . Seasonal allergies   . SVD (spontaneous vaginal delivery)    x 2    Past Surgical History:  Procedure Laterality Date  . APPENDECTOMY  1988  . COLPOSCOPY  1994   ASCUS  . DILATION AND CURETTAGE OF UTERUS N/A 06/13/2014   Procedure: DILATATION AND CURETTAGE;  Surgeon: Nunzio Cobbs, MD;  Location: [facility];  Service: Gynecology;  Laterality: N/A;   . HYSTEROSCOPY N/A 06/13/2014   Procedure: HYSTEROSCOPY WITH NOVASURE ABLATION and removal of malpositioned IUD ;  Surgeon: Nunzio Cobbs, MD;  Location: [facility];  Service: Gynecology;  Laterality: N/A;  . LAPAROSCOPIC TUBAL LIGATION N/A 06/13/2014   Procedure: LAPAROSCOPIC BILATERAL TUBAL LIGATION WITH CAUTERY ;  Surgeon: Nunzio Cobbs, MD;  Location: [facility];  Service: Gynecology;  Laterality: N/A;  . thyroid biopsy  10/11   hyplerplastic thyroid nodule- multinodular goiter  . TUBAL LIGATION  06-13-14  . WISDOM TOOTH EXTRACTION      Current Outpatient Medications  Medication Sig Dispense Refill  . fluticasone (FLONASE) 50 MCG/ACT nasal spray Place into both nostrils as needed for allergies or rhinitis.    Marland Kitchen ibuprofen (ADVIL,MOTRIN) 800 MG tablet Take 1 tablet (800 mg total) by mouth every 8 (eight) hours as needed. 30 tablet 0  . methocarbamol (ROBAXIN) 750 MG tablet Take 750 mg by mouth every 6 (six) hours as needed. Reported on 02/26/2015  1  . naproxen (NAPROSYN) 500 MG tablet Take 500 mg by mouth 2 (two) times daily as needed. Reported on 02/26/2015  2   Current Facility-Administered Medications  Medication Dose Route Frequency Provider Last Rate Last Dose  . 0.9 %  sodium chloride infusion  500 mL Intravenous Continuous Gatha Mayer, MD        Family History  Problem Relation Age  of Onset  . Hypertension Mother   . Hypertension Father   . Colon cancer Neg Hx   . Rectal cancer Neg Hx     Review of Systems  All other systems reviewed and are negative.   Exam:   BP 118/70 (BP Location: Right Arm, Patient Position: Sitting, Cuff Size: Normal)   Pulse 70   Resp 18   Ht 5' 6.5" (1.689 m)   Wt 170 lb 6.4 oz (77.3 kg)   LMP 11/09/2017 (Exact Date)   BMI 27.09 kg/m     General appearance: alert, cooperative and appears stated age Head: Normocephalic, without obvious abnormality, atraumatic Neck: no adenopathy, supple, symmetrical, trachea midline and  thyroid normal to inspection and palpation Lungs: clear to auscultation bilaterally Breasts: normal appearance, no masses or tenderness, No nipple retraction or dimpling, No nipple discharge or bleeding, No axillary or supraclavicular adenopathy Heart: regular rate and rhythm Abdomen: soft, non-tender; no masses, no organomegaly Extremities: extremities normal, atraumatic, no cyanosis or edema Skin: Skin color, texture, turgor normal. No rashes or lesions Lymph nodes: Cervical, supraclavicular, and axillary nodes normal. No abnormal inguinal nodes palpated Neurologic: Grossly normal  Pelvic: External genitalia:  no lesions              Urethra:  normal appearing urethra with no masses, tenderness or lesions              Bartholins and Skenes: normal                 Vagina: normal appearing vagina with normal color and discharge, no lesions              Cervix: no lesions              Pap taken: No. Bimanual Exam:  Uterus:  normal size, contour, position, consistency, mobility, non-tender.              Adnexa: no mass, fullness, tenderness              Rectal exam: Yes.  .  Confirms.              Anus:  normal sphincter tone, no lesions  Chaperone was present for exam.  Assessment:   Well woman visit with normal exam. Status post BTL and endometrial ablation.  Hx abnormal pap in remote past.  Mild pelvic organ prolapse and genuine stress incontinence.  STD screening.  Goiter.  Followed by Dr. Soyla Murphy by [place] every 2 years.  Plan: Mammogram screening. Recommended self breast awareness. Pap and HR HPV as above. Guidelines for Calcium, Vitamin D, regular exercise program including cardiovascular and weight bearing exercise. STD screening.  Routine labs including TSH.  We discussed urinary incontinence and tx options - Impressa, pessary, PT, and midurethral sling. She may try Impressa.   ACOG HO on incontinence.  Follow up annually and prn.     After visit summary provided.

## 2017-11-13 ENCOUNTER — Other Ambulatory Visit (HOSPITAL_COMMUNITY)
Admission: RE | Admit: 2017-11-13 | Discharge: 2017-11-13 | Disposition: A | Discharge status: Home / Self Care | Payer: BC Managed Care – PPO | Source: Ambulatory Visit | Attending: Obstetrics and Gynecology | Admitting: Obstetrics and Gynecology

## 2017-11-13 ENCOUNTER — Other Ambulatory Visit: Payer: Self-pay

## 2017-11-13 ENCOUNTER — Ambulatory Visit: Payer: BC Managed Care – PPO | Admitting: Obstetrics and Gynecology

## 2017-11-13 ENCOUNTER — Encounter: Payer: Self-pay | Admitting: Obstetrics and Gynecology

## 2017-11-13 VITALS — BP 118/70 | HR 70 | Resp 18 | Ht 66.5 in | Wt 170.4 lb

## 2017-11-13 DIAGNOSIS — Z113 Encounter for screening for infections with a predominantly sexual mode of transmission: Secondary | ICD-10-CM | POA: Insufficient documentation

## 2017-11-13 DIAGNOSIS — Z01419 Encounter for gynecological examination (general) (routine) without abnormal findings: Secondary | ICD-10-CM

## 2017-11-13 NOTE — Patient Instructions (Signed)

## 2017-11-14 LAB — CBC
Hematocrit: 38.8 % (ref 34.0–46.6)
Hemoglobin: 12.9 g/dL (ref 11.1–15.9)
MCH: 31.4 pg (ref 26.6–33.0)
MCHC: 33.2 g/dL (ref 31.5–35.7)
MCV: 94 fL (ref 79–97)
PLATELETS: 362 10*3/uL (ref 150–450)
RBC: 4.11 x10E6/uL (ref 3.77–5.28)
RDW: 12.2 % — AB (ref 12.3–15.4)
WBC: 4.6 10*3/uL (ref 3.4–10.8)

## 2017-11-14 LAB — COMPREHENSIVE METABOLIC PANEL
A/G RATIO: 1.8 (ref 1.2–2.2)
ALT: 19 IU/L (ref 0–32)
AST: 20 IU/L (ref 0–40)
Albumin: 4.3 g/dL (ref 3.5–5.5)
Alkaline Phosphatase: 41 IU/L (ref 39–117)
BUN/Creatinine Ratio: 22 (ref 9–23)
BUN: 15 mg/dL (ref 6–24)
Bilirubin Total: 0.3 mg/dL (ref 0.0–1.2)
CO2: 23 mmol/L (ref 20–29)
Calcium: 8.9 mg/dL (ref 8.7–10.2)
Chloride: 103 mmol/L (ref 96–106)
Creatinine, Ser: 0.67 mg/dL (ref 0.57–1.00)
GFR, EST AFRICAN AMERICAN: 117 mL/min/{1.73_m2} (ref >59)
GFR, EST NON AFRICAN AMERICAN: 101 mL/min/{1.73_m2} (ref >59)
Globulin, Total: 2.4 g/dL (ref 1.5–4.5)
Glucose: 86 mg/dL (ref 65–99)
Potassium: 4.1 mmol/L (ref 3.5–5.2)
SODIUM: 142 mmol/L (ref 134–144)
TOTAL PROTEIN: 6.7 g/dL (ref 6.0–8.5)

## 2017-11-14 LAB — LIPID PANEL
CHOL/HDL RATIO: 3.2 ratio (ref 0.0–4.4)
Cholesterol, Total: 165 mg/dL (ref 100–199)
HDL: 51 mg/dL (ref >39)
LDL CALC: 94 mg/dL (ref 0–99)
TRIGLYCERIDES: 101 mg/dL (ref 0–149)
VLDL Cholesterol Cal: 20 mg/dL (ref 5–40)

## 2017-11-14 LAB — HEP, RPR, HIV PANEL
HEP B S AG: NEGATIVE
HIV Screen 4th Generation wRfx: NONREACTIVE
RPR: NONREACTIVE

## 2017-11-14 LAB — TSH: TSH: 1.18 u[IU]/mL (ref 0.450–4.500)

## 2017-11-14 LAB — HEPATITIS C ANTIBODY: HEP C VIRUS AB: 0.1 {s_co_ratio} (ref 0.0–0.9)

## 2017-11-16 LAB — CERVICOVAGINAL ANCILLARY ONLY
CHLAMYDIA, DNA PROBE: NEGATIVE
Neisseria Gonorrhea: NEGATIVE
Trichomonas: NEGATIVE

## 2017-11-18 ENCOUNTER — Telehealth: Payer: Self-pay | Admitting: *Deleted

## 2017-11-18 NOTE — Telephone Encounter (Signed)
-----   Message from Nunzio Cobbs, MD sent at 11/17/2017  3:16 PM EDT ----- Please report results to patient.  All STD testing is negative for HIV, syphilis, hep B and C, gonorrhea, chlamydia, and trichomonas.  Testing is normal for cholesterol, blood counts, thyroid, and blood chemistries.  I am highlighting this result so you know to contact the patient.

## 2017-11-18 NOTE — Telephone Encounter (Signed)
Patient returned call

## 2017-11-18 NOTE — Telephone Encounter (Signed)
Message left to return call to Chelsea [name] at [phone].    

## 2017-11-18 NOTE — Telephone Encounter (Signed)
Returned call to patient. All results reviewed with patient and she verbalized understanding.   Encounter closed.

## 2018-07-09 IMAGING — US US THYROID BIOPSY
1 series · 13 of 18 positions shown · non-contrast
Comparison: Ultrasound on October 04, 2015

MEDICATIONS:
1% lidocaine

COMPLICATIONS:
None immediate.

INDICATION: Indeterminate right thyroid nodule

EXAM:
ULTRASOUND GUIDED FINE NEEDLE ASPIRATION OF INDETERMINATE THYROID
NODULE
TECHNIQUE: Informed written consent was obtained from the patient after a
discussion of the risks, benefits and alternatives to treatment.
Questions regarding the procedure were encouraged and answered. A
timeout was performed prior to the initiation of the procedure.

[Series 1: us thyroid biopsy · 0.08mm/px · 18 acquisitions, 13 frames shown]
[im 1/18]
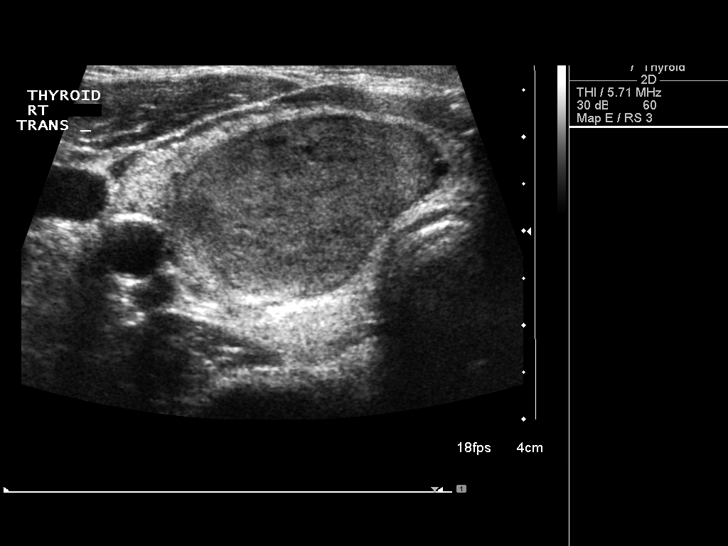
[im 3/18]
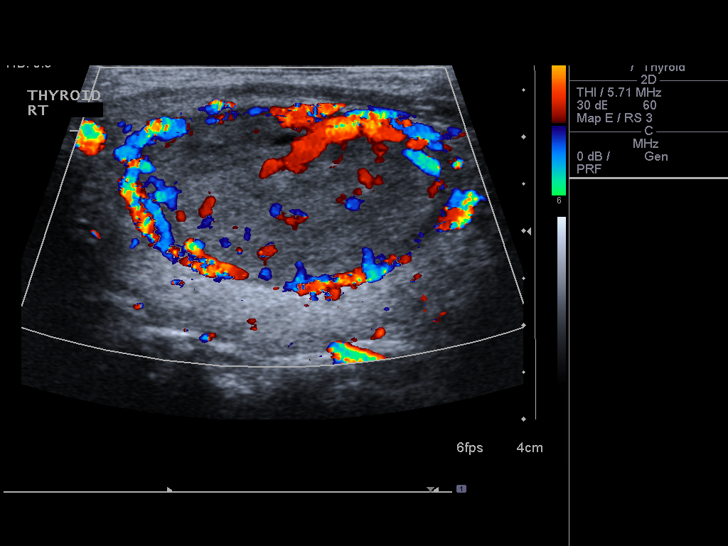
[im 4/18]
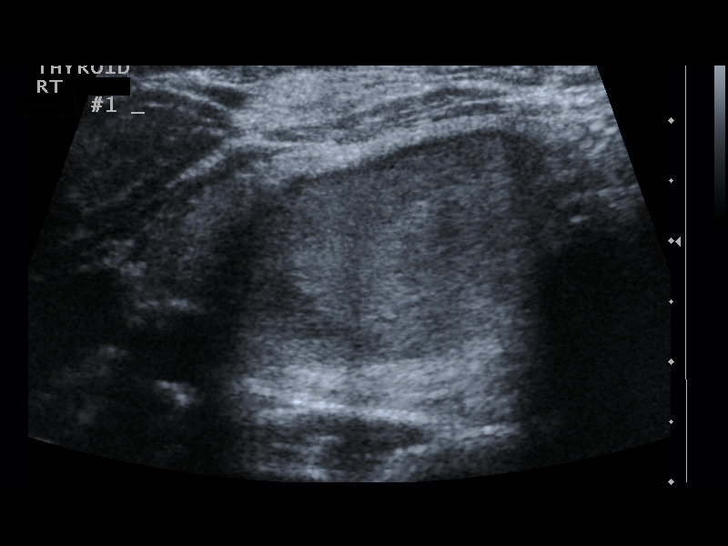
[im 5/18]
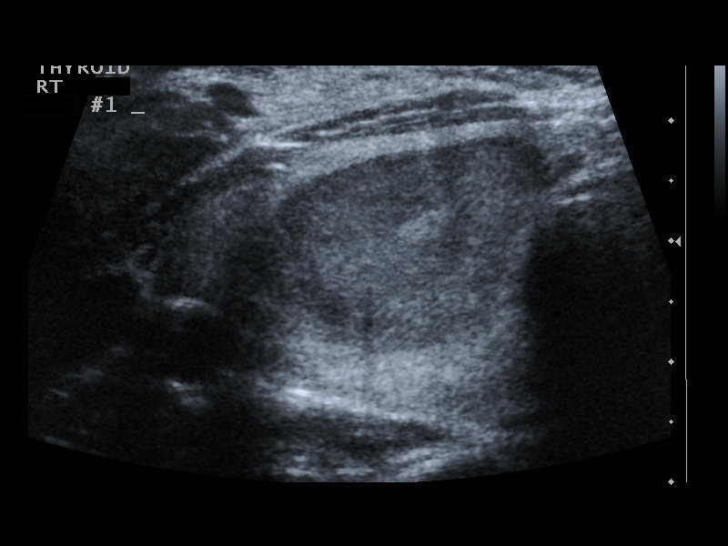
[im 7/18]
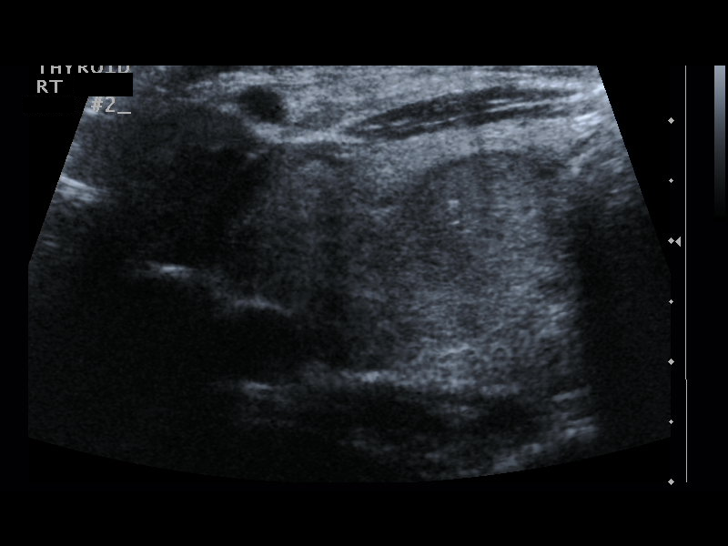
[im 8/18]
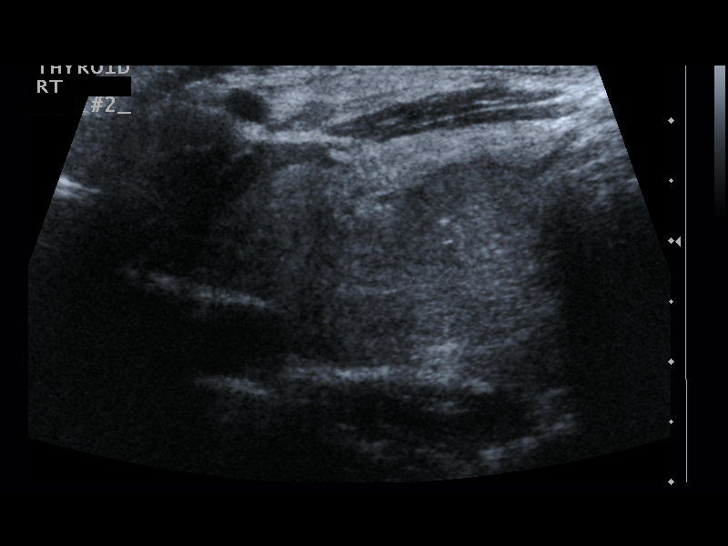
[im 10/18]
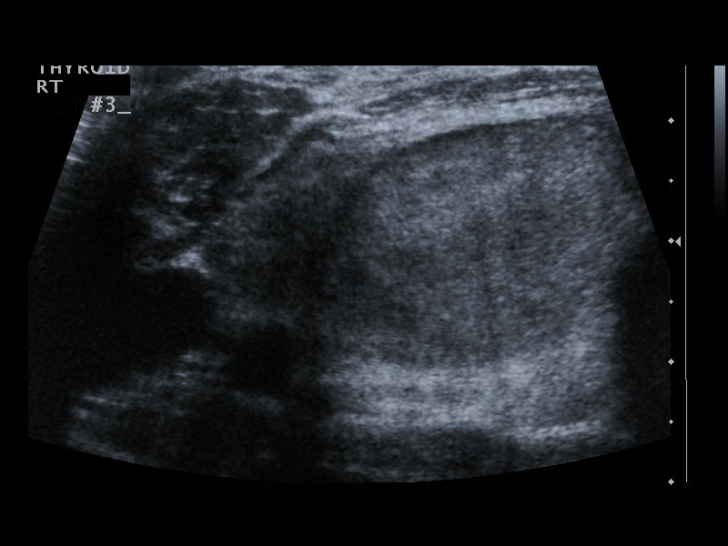
[im 11/18]
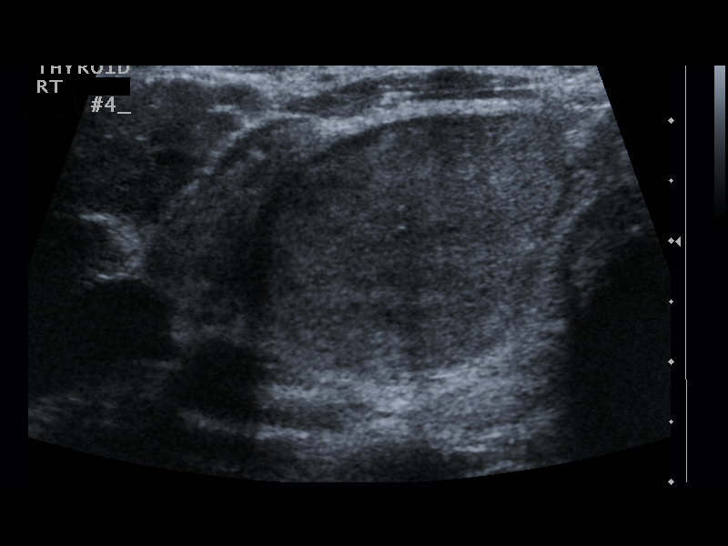
[im 12/18]
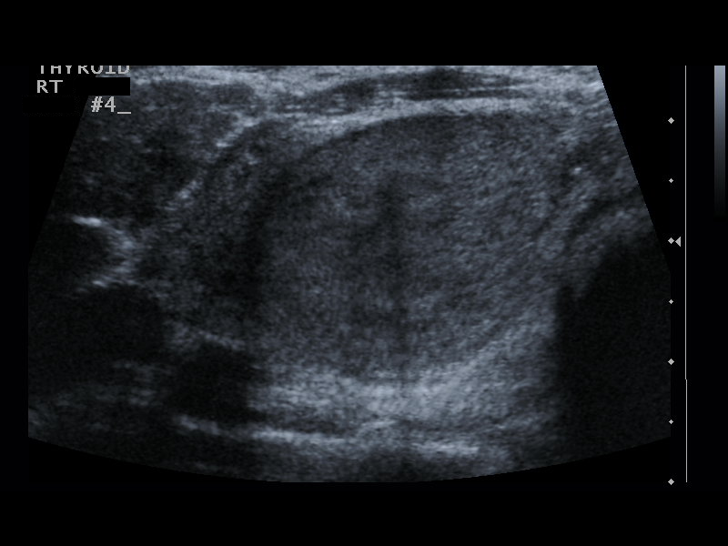
[im 14/18]
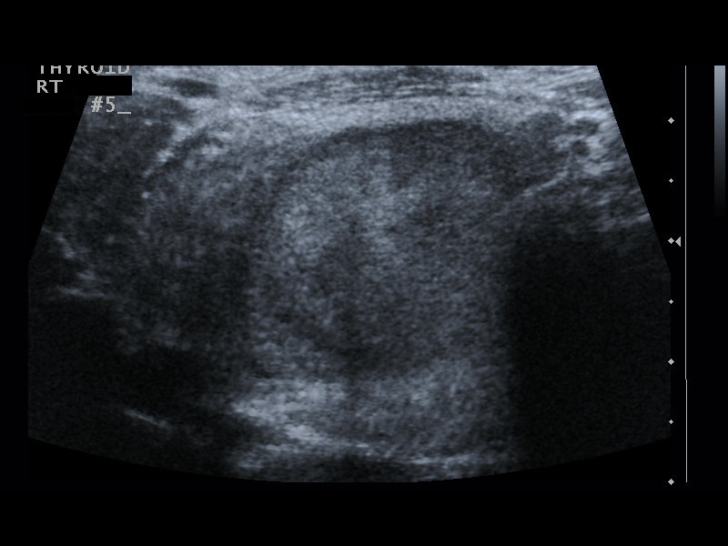
[im 15/18]
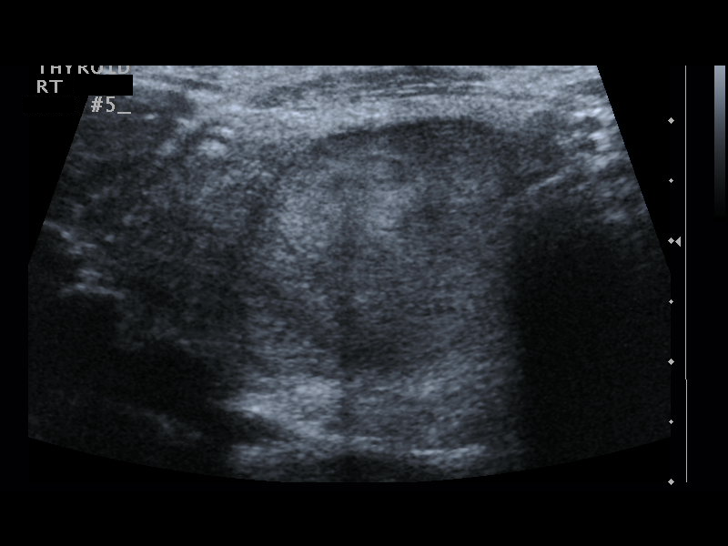
[im 16/18]
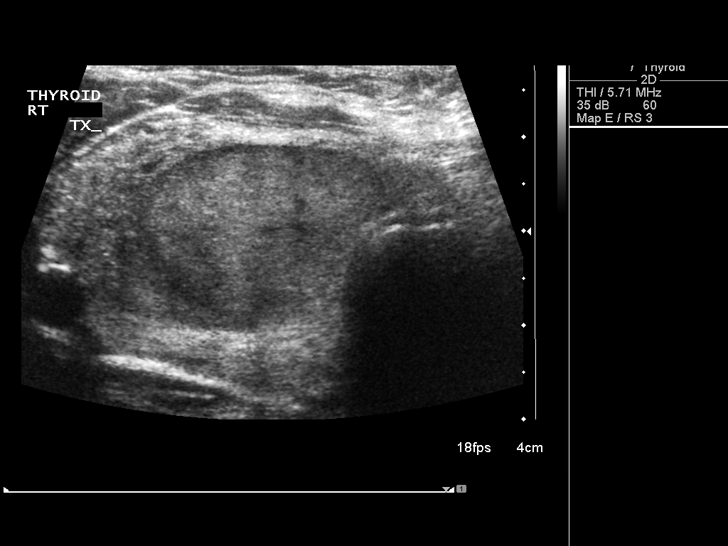
[im 18/18]
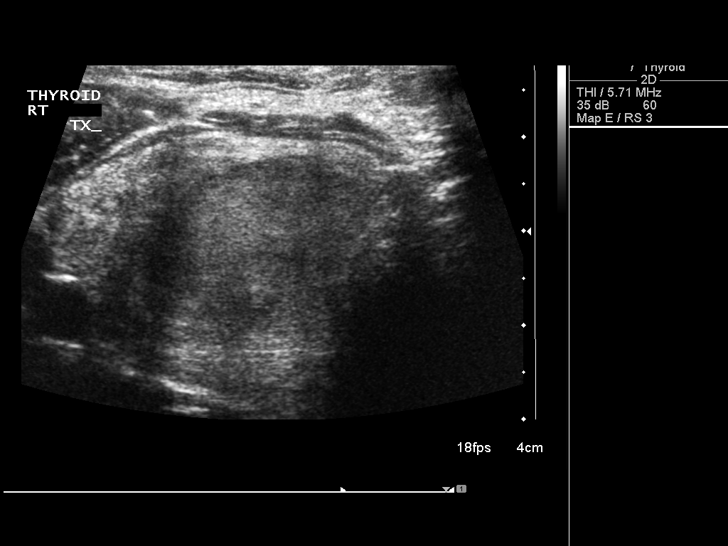

[13 of 18 positions shown; findings below may reference images not displayed]

Pre-procedural ultrasound scanning demonstrated unchanged size and
appearance of the indeterminate nodule within the right lobe of the
thyroid.

The procedure was planned. The neck was prepped in the usual sterile
fashion, and a sterile drape was applied covering the operative
field. A timeout was performed prior to the initiation of the
procedure. Local anesthesia was provided with 1% lidocaine.

Under direct ultrasound guidance, 5 FNA biopsies were performed of
the right thyroid nodule with a 25 gauge needle. Multiple ultrasound
images were saved for procedural documentation purposes. The samples
were prepared and submitted to pathology and [facility].

Limited post procedural scanning was negative for hematoma or
additional complication. Dressings were placed. The patient
tolerated the above procedures procedure well without immediate
postprocedural complication.
FINDINGS: FINDINGS
Nodule reference number based on prior diagnostic ultrasound: 1

Maximum size: 3.7 cm

Location: Right  ;  mid

ACR TI-RADS total points: 4

ACR TI-RADS risk category:  TR4

Prior biopsy:  Yes

Reason for biopsy: meets ACR TI-RADS criteria

Ultrasound imaging confirms appropriate placement of the needles
within the thyroid nodule.
IMPRESSION: Technically successful ultrasound guided fine needle aspiration of
the right thyroid nodule.

## 2018-10-27 ENCOUNTER — Other Ambulatory Visit: Payer: Self-pay | Admitting: Endocrinology

## 2018-10-27 DIAGNOSIS — E041 Nontoxic single thyroid nodule: Secondary | ICD-10-CM

## 2018-11-03 ENCOUNTER — Ambulatory Visit
Admission: RE | Admit: 2018-11-03 | Discharge: 2018-11-03 | Disposition: A | Discharge status: Home / Self Care | Payer: BC Managed Care – PPO | Source: Ambulatory Visit | Attending: Endocrinology | Admitting: Endocrinology

## 2018-11-03 DIAGNOSIS — E041 Nontoxic single thyroid nodule: Secondary | ICD-10-CM

## 2018-11-16 ENCOUNTER — Other Ambulatory Visit: Payer: Self-pay

## 2018-11-18 ENCOUNTER — Encounter: Payer: Self-pay | Admitting: Obstetrics and Gynecology

## 2018-11-18 ENCOUNTER — Ambulatory Visit: Payer: BC Managed Care – PPO | Admitting: Obstetrics and Gynecology

## 2018-11-18 ENCOUNTER — Other Ambulatory Visit (HOSPITAL_COMMUNITY)
Admission: RE | Admit: 2018-11-18 | Discharge: 2018-11-18 | Disposition: A | Discharge status: Home / Self Care | Payer: BC Managed Care – PPO | Source: Ambulatory Visit | Attending: Obstetrics and Gynecology | Admitting: Obstetrics and Gynecology

## 2018-11-18 ENCOUNTER — Other Ambulatory Visit: Payer: Self-pay

## 2018-11-18 VITALS — BP 128/76 | HR 60 | Temp 97.7°F | Resp 14 | Ht 66.5 in | Wt 166.6 lb

## 2018-11-18 DIAGNOSIS — Z23 Encounter for immunization: Secondary | ICD-10-CM

## 2018-11-18 DIAGNOSIS — Z113 Encounter for screening for infections with a predominantly sexual mode of transmission: Secondary | ICD-10-CM | POA: Diagnosis not present

## 2018-11-18 DIAGNOSIS — Z01419 Encounter for gynecological examination (general) (routine) without abnormal findings: Secondary | ICD-10-CM | POA: Diagnosis not present

## 2018-11-18 MED ORDER — ESCITALOPRAM OXALATE 10 MG PO TABS: 10.0000 mg | ORAL_TABLET | Freq: Every day | ORAL | 1 refills | Status: DC

## 2018-11-18 NOTE — Patient Instructions (Signed)

## 2018-11-18 NOTE — Progress Notes (Signed)
53 y.o. G60P2012 Divorced Caucasian female here for annual exam.    Menses are light.  Not tracking them.  No flashes.   Used Lexapro in the past when she was dealing with loss.  It worked well.  She is feeling sad due to the pandemic.  Family has moved in and is stressful.  Saw Dr. Suzette Battiest and no change on her thyroid nodules.  [employer], hospital.   PCP:  Kathyrn Lass, MD   Patient's last menstrual period was 11/04/2018 (approximate).           Sexually active: No.  The current method of family planning is tubal ligation.    Exercising: Yes.    walks 30 minutes/day Smoker:  no  Health Maintenance: Pap: 10-29-15 Neg:Neg HR HPV, 08-30-12 Neg:Neg HR HPV  History of abnormal Pap:  Yes,ASCUS 1994. Colpo and cryotherapy to cervix   MMG: 01-11-18 3D/Neg/density C/BiRads1 Colonoscopy: 01-04-16 polyp;next due 12/2018 BMD:   n/a  Result  n/a TDaP:  11-03-16 Gardasil:   n/a HIV:11-13-17 NR Hep C: 11-13-17 Neg Screening Labs:  PCP.  Flu vaccine:  Recommended.    reports that she has never smoked. She has never used smokeless tobacco. She reports current alcohol use of about 7.0 standard drinks of alcohol per week. She reports that she does not use drugs.  Past Medical History:  Diagnosis Date  . Anemia   . Depression 2004   History - situational, no meds  . Encounter for insertion of mirena IUD 08/04/08  . Enlarged thyroid    being monitored yearly  . Hx of adenomatous polyp of colon 01/23/2016  . Multiple thyroid nodules    being monitored yearly  . Seasonal allergies   . Seasonal allergies   . SVD (spontaneous vaginal delivery)    x 2    Past Surgical History:  Procedure Laterality Date  . APPENDECTOMY  1988  . COLPOSCOPY  1994   ASCUS  . DILATION AND CURETTAGE OF UTERUS N/A 06/13/2014   Procedure: DILATATION AND CURETTAGE;  Surgeon: Nunzio Cobbs, MD;  Location: [facility];  Service: Gynecology;  Laterality: N/A;  . HYSTEROSCOPY N/A 06/13/2014   Procedure:  HYSTEROSCOPY WITH NOVASURE ABLATION and removal of malpositioned IUD ;  Surgeon: Nunzio Cobbs, MD;  Location: [facility];  Service: Gynecology;  Laterality: N/A;  . LAPAROSCOPIC TUBAL LIGATION N/A 06/13/2014   Procedure: LAPAROSCOPIC BILATERAL TUBAL LIGATION WITH CAUTERY ;  Surgeon: Nunzio Cobbs, MD;  Location: [facility];  Service: Gynecology;  Laterality: N/A;  . thyroid biopsy  10/11   hyplerplastic thyroid nodule- multinodular goiter  . TUBAL LIGATION  06-13-14  . WISDOM TOOTH EXTRACTION      Current Outpatient Medications  Medication Sig Dispense Refill  . fluticasone (FLONASE) 50 MCG/ACT nasal spray Place into both nostrils as needed for allergies or rhinitis.    Marland Kitchen ibuprofen (ADVIL,MOTRIN) 800 MG tablet Take 1 tablet (800 mg total) by mouth every 8 (eight) hours as needed. 30 tablet 0  . methocarbamol (ROBAXIN) 500 MG tablet Take 500 mg by mouth 2 (two) times daily.    . naproxen (NAPROSYN) 500 MG tablet Take 500 mg by mouth 2 (two) times daily as needed. Reported on 02/26/2015  2   Current Facility-Administered Medications  Medication Dose Route Frequency Provider Last Rate Last Dose  . 0.9 %  sodium chloride infusion  500 mL Intravenous Continuous Gatha Mayer, MD        Family History  Problem Relation Age of Onset  . Hypertension Mother   . Hypertension Father   . Colon cancer Neg Hx   . Rectal cancer Neg Hx     Review of Systems  All other systems reviewed and are negative.   Exam:   BP 128/76   Pulse 60   Temp 97.7 F (36.5 C) (Temporal)   Resp 14   Ht 5' 6.5" (1.689 m)   Wt 166 lb 9.6 oz (75.6 kg)   LMP 11/04/2018 (Approximate)   BMI 26.49 kg/m     General appearance: alert, cooperative and appears stated age Head: normocephalic, without obvious abnormality, atraumatic Neck: no adenopathy, supple, symmetrical, trachea midline and thyroid normal to inspection and palpation Lungs: clear to auscultation bilaterally Breasts: normal appearance,  no masses or tenderness, No nipple retraction or dimpling, No nipple discharge or bleeding, No axillary adenopathy Heart: regular rate and rhythm Abdomen: soft, non-tender; no masses, no organomegaly Extremities: extremities normal, atraumatic, no cyanosis or edema Skin: skin color, texture, turgor normal. No rashes or lesions Lymph nodes: cervical, supraclavicular, and axillary nodes normal. Neurologic: grossly normal  Pelvic: External genitalia:  no lesions              No abnormal inguinal nodes palpated.              Urethra:  normal appearing urethra with no masses, tenderness or lesions              Bartholins and Skenes: normal                 Vagina: normal appearing vagina with normal color and discharge, no lesions              Cervix: no lesions              Pap taken: No. Bimanual Exam:  Uterus:  normal size, contour, position, consistency, mobility, non-tender              Adnexa: no mass, fullness, tenderness              Rectal exam: Yes.  .  Confirms.              Anus:  normal sphincter tone, no lesions  Chaperone was present for exam.  Assessment:   Well woman visit with normal exam. Status post BTL and endometrial ablation.  Hx abnormal pap in remote past.  Mild pelvic organ prolapse and genuine stress incontinence.  Goiter.  Followed by Dr. Soyla Murphy by [place] every 2 years. Situational stress.   Plan: Mammogram screening discussed. Self breast awareness reviewed. Pap and HR HPV as above. Guidelines for Calcium, Vitamin D, regular exercise program including cardiovascular and weight bearing exercise. Lexapro 10 mg daily.   FU in 6 weeks.  Referral to counseling.  Flu vaccine.  Routine labs. STD testing.  Follow up annually and prn.   After visit summary provided.

## 2018-11-19 LAB — COMPREHENSIVE METABOLIC PANEL
ALT: 28 IU/L (ref 0–32)
AST: 19 IU/L (ref 0–40)
Albumin/Globulin Ratio: 1.9 (ref 1.2–2.2)
Albumin: 4.4 g/dL (ref 3.8–4.9)
Alkaline Phosphatase: 53 IU/L (ref 39–117)
BUN/Creatinine Ratio: 17 (ref 9–23)
BUN: 14 mg/dL (ref 6–24)
Bilirubin Total: 0.2 mg/dL (ref 0.0–1.2)
CO2: 25 mmol/L (ref 20–29)
Calcium: 9 mg/dL (ref 8.7–10.2)
Chloride: 104 mmol/L (ref 96–106)
Creatinine, Ser: 0.81 mg/dL (ref 0.57–1.00)
GFR calc Af Amer: 96 mL/min/{1.73_m2} (ref >59)
GFR calc non Af Amer: 83 mL/min/{1.73_m2} (ref >59)
Globulin, Total: 2.3 g/dL (ref 1.5–4.5)
Glucose: 104 mg/dL — ABNORMAL HIGH (ref 65–99)
Potassium: 4.1 mmol/L (ref 3.5–5.2)
Sodium: 141 mmol/L (ref 134–144)
Total Protein: 6.7 g/dL (ref 6.0–8.5)

## 2018-11-19 LAB — HEP, RPR, HIV PANEL
HIV Screen 4th Generation wRfx: NONREACTIVE
Hepatitis B Surface Ag: NEGATIVE
RPR Ser Ql: NONREACTIVE

## 2018-11-19 LAB — CBC
Hematocrit: 39.2 % (ref 34.0–46.6)
Hemoglobin: 13.5 g/dL (ref 11.1–15.9)
MCH: 32.5 pg (ref 26.6–33.0)
MCHC: 34.4 g/dL (ref 31.5–35.7)
MCV: 95 fL (ref 79–97)
Platelets: 300 10*3/uL (ref 150–450)
RBC: 4.15 x10E6/uL (ref 3.77–5.28)
RDW: 11.3 % — ABNORMAL LOW (ref 11.7–15.4)
WBC: 6.7 10*3/uL (ref 3.4–10.8)

## 2018-11-19 LAB — LIPID PANEL
Chol/HDL Ratio: 3.5 ratio (ref 0.0–4.4)
Cholesterol, Total: 183 mg/dL (ref 100–199)
HDL: 53 mg/dL (ref >39)
LDL Chol Calc (NIH): 101 mg/dL — ABNORMAL HIGH (ref 0–99)
Triglycerides: 169 mg/dL — ABNORMAL HIGH (ref 0–149)
VLDL Cholesterol Cal: 29 mg/dL (ref 5–40)

## 2018-11-19 LAB — HEPATITIS C ANTIBODY: Hep C Virus Ab: <0.1 s/co ratio (ref 0.0–0.9)

## 2018-11-23 LAB — CERVICOVAGINAL ANCILLARY ONLY
Chlamydia: NEGATIVE
Comment: NEGATIVE
Comment: NEGATIVE
Comment: NORMAL
Neisseria Gonorrhea: NEGATIVE
Trichomonas: NEGATIVE

## 2019-01-12 ENCOUNTER — Encounter: Payer: Self-pay | Admitting: Obstetrics and Gynecology

## 2019-02-14 ENCOUNTER — Encounter: Payer: Self-pay | Admitting: Internal Medicine

## 2019-02-15 ENCOUNTER — Encounter: Payer: Self-pay | Admitting: Internal Medicine

## 2019-02-28 DIAGNOSIS — Z8616 Personal history of COVID-19: Secondary | ICD-10-CM

## 2019-02-28 HISTORY — DX: Personal history of COVID-19: Z86.16

## 2019-04-08 ENCOUNTER — Ambulatory Visit (AMBULATORY_SURGERY_CENTER): Payer: Self-pay | Admitting: *Deleted

## 2019-04-08 ENCOUNTER — Other Ambulatory Visit: Payer: Self-pay

## 2019-04-08 VITALS — Temp 97.1°F | Ht 66.5 in | Wt 170.0 lb

## 2019-04-08 DIAGNOSIS — Z86010 Personal history of colonic polyps: Secondary | ICD-10-CM

## 2019-04-08 DIAGNOSIS — Z01818 Encounter for other preprocedural examination: Secondary | ICD-10-CM

## 2019-04-08 NOTE — Progress Notes (Signed)
Patient is here in-person for PV. Patient denies any allergies to eggs or soy. Patient denies any problems with anesthesia/sedation. Patient denies any oxygen use at home. Patient denies taking any diet/weight loss medications or blood thinners. Patient is not being treated for MRSA or C-diff.   COVID-19 screening test is on 3/24, the pt is aware.  Patient is aware of our care-partner policy and 0000000 safety regulations.

## 2019-04-19 ENCOUNTER — Encounter: Payer: Self-pay | Admitting: Internal Medicine

## 2019-04-20 ENCOUNTER — Ambulatory Visit (INDEPENDENT_AMBULATORY_CARE_PROVIDER_SITE_OTHER): Payer: BC Managed Care – PPO

## 2019-04-20 ENCOUNTER — Other Ambulatory Visit: Payer: Self-pay | Admitting: Internal Medicine

## 2019-04-20 ENCOUNTER — Other Ambulatory Visit: Payer: Self-pay

## 2019-04-20 DIAGNOSIS — Z1159 Encounter for screening for other viral diseases: Secondary | ICD-10-CM

## 2019-04-21 LAB — SARS CORONAVIRUS 2 (TAT 6-24 HRS): SARS Coronavirus 2: NEGATIVE

## 2019-04-22 ENCOUNTER — Ambulatory Visit (AMBULATORY_SURGERY_CENTER): Payer: BC Managed Care – PPO | Admitting: Internal Medicine

## 2019-04-22 ENCOUNTER — Other Ambulatory Visit: Payer: Self-pay

## 2019-04-22 ENCOUNTER — Encounter: Payer: Self-pay | Admitting: Internal Medicine

## 2019-04-22 VITALS — BP 129/80 | HR 63 | Temp 96.9°F | Resp 14

## 2019-04-22 DIAGNOSIS — Z86010 Personal history of colonic polyps: Secondary | ICD-10-CM

## 2019-04-22 HISTORY — PX: COLONOSCOPY: SHX174

## 2019-04-22 MED ORDER — SODIUM CHLORIDE 0.9 % IV SOLN: 500.0000 mL | Freq: Once | INTRAVENOUS | Status: DC

## 2019-04-22 NOTE — Progress Notes (Signed)
A/ox3, pleased with MAC, report to RN 

## 2019-04-22 NOTE — Op Note (Signed)
[place] Patient Name: Chelsea [name] Procedure Date: 04/22/2019 12:03 PM MRN: [id number] Endoscopist: Gatha Mayer , MD Age: 54 Referring MD:  Date of Birth: 06-Mar-1965 Gender: Female Account #: [id number] Procedure:                Colonoscopy Indications:              Surveillance: Personal history of adenomatous                            polyps on last colonoscopy > 3 years ago Medicines:                Propofol per Anesthesia, Monitored Anesthesia Care Procedure:                Pre-Anesthesia Assessment:                           - Prior to the procedure, a History and Physical                            was performed, and patient medications and                            allergies were reviewed. The patient's tolerance of                            previous anesthesia was also reviewed. The risks                            and benefits of the procedure and the sedation                            options and risks were discussed with the patient.                            All questions were answered, and informed consent                            was obtained. Prior Anticoagulants: The patient has                            taken no previous anticoagulant or antiplatelet                            agents. ASA Grade Assessment: II - A patient with                            mild systemic disease. After reviewing the risks                            and benefits, the patient was deemed in                            satisfactory condition to undergo the procedure.  After obtaining informed consent, the colonoscope                            was passed under direct vision. Throughout the                            procedure, the patient's blood pressure, pulse, and                            oxygen saturations were monitored continuously. The                            Colonoscope was introduced through the anus and   advanced to the the cecum, identified by                            appendiceal orifice and ileocecal valve. The                            colonoscopy was performed without difficulty. The                            patient tolerated the procedure well. The quality                            of the bowel preparation was excellent. The bowel                            preparation used was Miralax via split dose                            instruction. The ileocecal valve, appendiceal                            orifice, and rectum were photographed. Scope In: 12:13:48 PM Scope Out: 12:30:39 PM Scope Withdrawal Time: 0 hours 14 minutes 13 seconds  Total Procedure Duration: 0 hours 16 minutes 51 seconds  Findings:                 The perianal and digital rectal examinations were                            normal.                           The entire examined colon appeared normal on direct                            and retroflexion views. Complications:            No immediate complications. Estimated Blood Loss:     Estimated blood loss: none. Impression:               - The entire examined colon is normal on direct and  retroflexion views.                           - No specimens collected.                           - Personal history of colonic polyp 12 mm adenoma                            12/2015. Recommendation:           - Patient has a contact number available for                            emergencies. The signs and symptoms of potential                            delayed complications were discussed with the                            patient. Return to normal activities tomorrow.                            Written discharge instructions were provided to the                            patient.                           - Resume previous diet.                           - Continue present medications.                           - Repeat colonoscopy in 5  years for surveillance. Gatha Mayer, MD 04/22/2019 12:37:41 PM This report has been signed electronically.

## 2019-04-22 NOTE — Patient Instructions (Addendum)
No polyps today. Guidelines recommend that you repeat a colonoscopy in 5 years.  I appreciate the opportunity to care for you. Gatha Mayer, MD, [facility]  Read all of the handouts given to you by your recovery room nurse.  Thank-you for choosing [place] for your healthcare needs today.  YOU HAD AN ENDOSCOPIC PROCEDURE TODAY AT [facility]:   Refer to the procedure report that was given to you for any specific questions about what was found during the examination.  If the procedure report does not answer your questions, please call your gastroenterologist to clarify.  If you requested that your care partner not be given the details of your procedure findings, then the procedure report has been included in a sealed envelope for you to review at your convenience later.  YOU SHOULD EXPECT: Some feelings of bloating in the abdomen. Passage of more gas than usual.  Walking can help get rid of the air that was put into your GI tract during the procedure and reduce the bloating. If you had a lower endoscopy (such as a colonoscopy or flexible sigmoidoscopy) you may notice spotting of blood in your stool or on the toilet paper. If you underwent a bowel prep for your procedure, you may not have a normal bowel movement for a few days.  Please Note:  You might notice some irritation and congestion in your nose or some drainage.  This is from the oxygen used during your procedure.  There is no need for concern and it should clear up in a day or so.  SYMPTOMS TO REPORT IMMEDIATELY:   Following lower endoscopy (colonoscopy or flexible sigmoidoscopy):  Excessive amounts of blood in the stool  Significant tenderness or worsening of abdominal pains  Swelling of the abdomen that is new, acute  Fever of 100F or higher   For urgent or emergent issues, a gastroenterologist can be reached at any hour by calling [phone]. Do not use MyChart messaging for urgent concerns.    DIET:  We do  recommend a small meal at first, but then you may proceed to your regular diet.  Drink plenty of fluids but you should avoid alcoholic beverages for 24 hours.  ACTIVITY:  You should plan to take it easy for the rest of today and you should NOT DRIVE or use heavy machinery until tomorrow (because of the sedation medicines used during the test).    FOLLOW UP: Our staff will call the number listed on your records 48-72 hours following your procedure to check on you and address any questions or concerns that you may have regarding the information given to you following your procedure. If we do not reach you, we will leave a message.  We will attempt to reach you two times.  During this call, we will ask if you have developed any symptoms of COVID 19. If you develop any symptoms (ie: fever, flu-like symptoms, shortness of breath, cough etc.) before then, please call [phone].  If you test positive for Covid 19 in the 2 weeks post procedure, please call and report this information to [place].     SIGNATURES/CONFIDENTIALITY: You and/or your care partner have signed paperwork which will be entered into your electronic medical record.  These signatures attest to the fact that that the information above on your After Visit Summary has been reviewed and is understood.  Full responsibility of the confidentiality of this discharge information lies with you and/or your care-partner.

## 2019-04-22 NOTE — Progress Notes (Signed)
Pt's states no medical or surgical changes since previsit or office visit.  Temp LC Vitals CW

## 2019-04-26 ENCOUNTER — Telehealth: Payer: Self-pay | Admitting: *Deleted

## 2019-04-26 NOTE — Telephone Encounter (Signed)
  Follow up Call-  Call back number 04/22/2019  Post procedure Call Back phone  # [phone]  Permission to leave phone message Yes  Some recent data might be hidden     Patient questions:  Message left to call us if necessary.

## 2019-04-26 NOTE — Telephone Encounter (Signed)
  Follow up Call-  Call back number 04/22/2019  Post procedure Call Back phone  # [phone]  Permission to leave phone message Yes  Some recent data might be hidden     Patient questions:  Do you have a fever, pain , or abdominal swelling? No. Pain Score  0 *  Have you tolerated food without any problems? Yes.    Have you been able to return to your normal activities? Yes.    Do you have any questions about your discharge instructions: Diet   No. Medications  No. Follow up visit  No.  Do you have questions or concerns about your Care? No.  Actions: * If pain score is 4 or above: No action needed, pain <4.  1. Have you developed a fever since your procedure? no  2.   Have you had an respiratory symptoms (SOB or cough) since your procedure? no  3.   Have you tested positive for COVID 19 since your procedure no  4.   Have you had any family members/close contacts diagnosed with the COVID 19 since your procedure?  no   If yes to any of these questions please route to Joylene John, RN and Erenest Rasher, RN

## 2019-11-21 NOTE — Progress Notes (Signed)
54 y.o. [id number] Divorced Asian/Caucacian female here for annual exam.    Ex husband died.  Family stress.   Did take Lexapro for a short time last year.  Concerned about weight gain.   Does see a therapist.   Menses once a month.  Had a painful ovulation, and then painful start to the cycle. No hot flashes.   Does want general labs and STD testing today.  Completed Covid vaccine.  Did also have Covid.  No flu vaccine.  PCP:  Kathyrn Lass, MD   Patient's last menstrual period was 11/20/2019 (exact date).           Sexually active: No.  The current method of family planning is tubal ligation.    Exercising: Yes.    walking Smoker:  no  Health Maintenance: Pap: 10-29-15 Neg:Neg HR HPV, 08-30-12 Neg:Neg HR HPV History of abnormal Pap:  Yes, ASCUS 1994. Colpo and cryotherapy to cervix  MMG:  01-12-19 3D/benign nodule Rt.Br./Lt.Br.Neg/density C/Birads2 Colonoscopy:  04-22-19 normal;next 5 years d/t hx of colon polyps BMD:   n/a  Result  n/a TDaP: 11-03-16 Gardasil:   no HIV: 11-18-18 NR Hep C: 11-18-18 Neg Screening Labs:  Today.   reports that she has never smoked. She has never used smokeless tobacco. She reports current alcohol use of about 2.0 - 3.0 standard drinks of alcohol per week. She reports that she does not use drugs.  Past Medical History:  Diagnosis Date  . Allergy   . Anemia   . Depression 2004   History - situational, no meds  . Encounter for insertion of mirena IUD 08/04/08  . Enlarged thyroid    being monitored yearly  . History of COVID-19 02/2019  . Hx of adenomatous polyp of colon 01/23/2016  . Multiple thyroid nodules    being monitored yearly  . Seasonal allergies   . Seasonal allergies   . SVD (spontaneous vaginal delivery)    x 2    Past Surgical History:  Procedure Laterality Date  . APPENDECTOMY  1988  . COLONOSCOPY  04/22/2019  . COLONOSCOPY  01/04/2016  . COLPOSCOPY  1994   ASCUS  . DILATION AND CURETTAGE OF UTERUS N/A 06/13/2014    Procedure: DILATATION AND CURETTAGE;  Surgeon: Nunzio Cobbs, MD;  Location: [facility];  Service: Gynecology;  Laterality: N/A;  . HYSTEROSCOPY N/A 06/13/2014   Procedure: HYSTEROSCOPY WITH NOVASURE ABLATION and removal of malpositioned IUD ;  Surgeon: Nunzio Cobbs, MD;  Location: [facility];  Service: Gynecology;  Laterality: N/A;  . LAPAROSCOPIC TUBAL LIGATION N/A 06/13/2014   Procedure: LAPAROSCOPIC BILATERAL TUBAL LIGATION WITH CAUTERY ;  Surgeon: Nunzio Cobbs, MD;  Location: [facility];  Service: Gynecology;  Laterality: N/A;  . thyroid biopsy  10/11   hyplerplastic thyroid nodule- multinodular goiter  . TUBAL LIGATION  06-13-14  . WISDOM TOOTH EXTRACTION      Current Outpatient Medications  Medication Sig Dispense Refill  . Ibuprofen (ADVIL) 200 MG CAPS Take by mouth.    . methocarbamol (ROBAXIN) 500 MG tablet Take 1 tablet by mouth as needed. c    . naproxen (NAPROSYN) 500 MG tablet Take 500 mg by mouth 2 (two) times daily as needed. Reported on 02/26/2015  2   No current facility-administered medications for this visit.    Family History  Problem Relation Age of Onset  . Hypertension Mother   . Hypertension Father   . Colon polyps Father   .  Colon cancer Neg Hx   . Rectal cancer Neg Hx   . Esophageal cancer Neg Hx   . Stomach cancer Neg Hx     Review of Systems  All other systems reviewed and are negative.   Exam:   BP 110/66   Pulse 86   Ht 5\' 7"  (1.702 m)   Wt 169 lb (76.7 kg)   LMP 11/20/2019 (Exact Date)   SpO2 97%   BMI 26.47 kg/m     General appearance: alert, cooperative and appears stated age Head: normocephalic, without obvious abnormality, atraumatic Neck: no adenopathy, supple, symmetrical, trachea midline and thyroid enlarged.  Lungs: clear to auscultation bilaterally Breasts: normal appearance, no masses or tenderness, No nipple retraction or dimpling, No nipple discharge or bleeding, No axillary adenopathy Heart: regular  rate and rhythm Abdomen: soft, non-tender; no masses, no organomegaly Extremities: extremities normal, atraumatic, no cyanosis or edema Skin: skin color, texture, turgor normal. No rashes or lesions Lymph nodes: cervical, supraclavicular, and axillary nodes normal. Neurologic: grossly normal  Pelvic: External genitalia:  no lesions              No abnormal inguinal nodes palpated.              Urethra:  normal appearing urethra with no masses, tenderness or lesions              Bartholins and Skenes: normal                 Vagina: normal appearing vagina with normal color and discharge, no lesions              Cervix: absent              Pap taken: No. Bimanual Exam:  Uterus: absent              Adnexa: no mass, fullness, tenderness              Rectal exam: Yes.  .  Confirms.              Anus:  normal sphincter tone, no lesions  Chaperone was present for exam.  Assessment:   Well woman visit with normal exam. Enlarged thyroid.  Monitored by Dr. Suzette Battiest. Family stress.   Plan: Mammogram screening discussed. Self breast awareness reviewed. Pap and HR HPV as above. Guidelines for Calcium, Vitamin D, regular exercise program including cardiovascular and weight bearing exercise. STD screening, routine labs including TFTs. Support given for bereavement and stress. Follow up annually and prn.

## 2019-11-22 ENCOUNTER — Ambulatory Visit (INDEPENDENT_AMBULATORY_CARE_PROVIDER_SITE_OTHER): Payer: BC Managed Care – PPO | Admitting: Obstetrics and Gynecology

## 2019-11-22 ENCOUNTER — Other Ambulatory Visit (HOSPITAL_COMMUNITY)
Admission: RE | Admit: 2019-11-22 | Discharge: 2019-11-22 | Disposition: A | Discharge status: Home / Self Care | Payer: BC Managed Care – PPO | Source: Ambulatory Visit | Attending: Obstetrics and Gynecology | Admitting: Obstetrics and Gynecology

## 2019-11-22 ENCOUNTER — Other Ambulatory Visit: Payer: Self-pay

## 2019-11-22 ENCOUNTER — Encounter: Payer: Self-pay | Admitting: Obstetrics and Gynecology

## 2019-11-22 VITALS — BP 110/66 | HR 86 | Ht 67.0 in | Wt 169.0 lb

## 2019-11-22 DIAGNOSIS — Z113 Encounter for screening for infections with a predominantly sexual mode of transmission: Secondary | ICD-10-CM | POA: Diagnosis present

## 2019-11-22 DIAGNOSIS — E049 Nontoxic goiter, unspecified: Secondary | ICD-10-CM | POA: Diagnosis not present

## 2019-11-22 DIAGNOSIS — Z01419 Encounter for gynecological examination (general) (routine) without abnormal findings: Secondary | ICD-10-CM

## 2019-11-22 NOTE — Patient Instructions (Signed)

## 2019-11-23 LAB — COMPREHENSIVE METABOLIC PANEL
ALT: 22 IU/L (ref 0–32)
AST: 18 IU/L (ref 0–40)
Albumin/Globulin Ratio: 1.5 (ref 1.2–2.2)
Albumin: 4.3 g/dL (ref 3.8–4.9)
Alkaline Phosphatase: 55 IU/L (ref 44–121)
BUN/Creatinine Ratio: 14 (ref 9–23)
BUN: 12 mg/dL (ref 6–24)
Bilirubin Total: <0.2 mg/dL (ref 0.0–1.2)
CO2: 22 mmol/L (ref 20–29)
Calcium: 9.3 mg/dL (ref 8.7–10.2)
Chloride: 105 mmol/L (ref 96–106)
Creatinine, Ser: 0.86 mg/dL (ref 0.57–1.00)
GFR calc Af Amer: 89 mL/min/{1.73_m2} (ref >59)
GFR calc non Af Amer: 77 mL/min/{1.73_m2} (ref >59)
Globulin, Total: 2.8 g/dL (ref 1.5–4.5)
Glucose: 84 mg/dL (ref 65–99)
Potassium: 4 mmol/L (ref 3.5–5.2)
Sodium: 140 mmol/L (ref 134–144)
Total Protein: 7.1 g/dL (ref 6.0–8.5)

## 2019-11-23 LAB — LIPID PANEL
Chol/HDL Ratio: 3.5 ratio (ref 0.0–4.4)
Cholesterol, Total: 157 mg/dL (ref 100–199)
HDL: 45 mg/dL (ref >39)
LDL Chol Calc (NIH): 82 mg/dL (ref 0–99)
Triglycerides: 179 mg/dL — ABNORMAL HIGH (ref 0–149)
VLDL Cholesterol Cal: 30 mg/dL (ref 5–40)

## 2019-11-23 LAB — CBC
Hematocrit: 40.3 % (ref 34.0–46.6)
Hemoglobin: 13.7 g/dL (ref 11.1–15.9)
MCH: 32.4 pg (ref 26.6–33.0)
MCHC: 34 g/dL (ref 31.5–35.7)
MCV: 95 fL (ref 79–97)
Platelets: 392 10*3/uL (ref 150–450)
RBC: 4.23 x10E6/uL (ref 3.77–5.28)
RDW: 11.6 % — ABNORMAL LOW (ref 11.7–15.4)
WBC: 7.4 10*3/uL (ref 3.4–10.8)

## 2019-11-23 LAB — CERVICOVAGINAL ANCILLARY ONLY
Chlamydia: NEGATIVE
Comment: NEGATIVE
Comment: NEGATIVE
Comment: NORMAL
Neisseria Gonorrhea: NEGATIVE
Trichomonas: NEGATIVE

## 2019-11-23 LAB — HEP, RPR, HIV PANEL
HIV Screen 4th Generation wRfx: NONREACTIVE
Hepatitis B Surface Ag: NEGATIVE
RPR Ser Ql: NONREACTIVE

## 2019-11-23 LAB — HEPATITIS C ANTIBODY: Hep C Virus Ab: 0.1 s/co ratio (ref 0.0–0.9)

## 2019-11-23 LAB — T4, FREE: Free T4: 1.32 ng/dL (ref 0.82–1.77)

## 2019-11-23 LAB — TSH: TSH: 1.33 u[IU]/mL (ref 0.450–4.500)

## 2020-01-18 ENCOUNTER — Encounter: Payer: Self-pay | Admitting: Obstetrics and Gynecology

## 2020-11-26 ENCOUNTER — Ambulatory Visit (INDEPENDENT_AMBULATORY_CARE_PROVIDER_SITE_OTHER): Payer: BC Managed Care – PPO | Admitting: Obstetrics and Gynecology

## 2020-11-26 ENCOUNTER — Encounter: Payer: Self-pay | Admitting: Obstetrics and Gynecology

## 2020-11-26 ENCOUNTER — Other Ambulatory Visit: Payer: Self-pay

## 2020-11-26 ENCOUNTER — Other Ambulatory Visit (HOSPITAL_COMMUNITY)
Admission: RE | Admit: 2020-11-26 | Discharge: 2020-11-26 | Disposition: A | Discharge status: Home / Self Care | Payer: BC Managed Care – PPO | Source: Ambulatory Visit | Attending: Obstetrics and Gynecology | Admitting: Obstetrics and Gynecology

## 2020-11-26 VITALS — BP 110/68 | HR 67 | Ht 67.0 in | Wt 176.0 lb

## 2020-11-26 DIAGNOSIS — Z124 Encounter for screening for malignant neoplasm of cervix: Secondary | ICD-10-CM | POA: Insufficient documentation

## 2020-11-26 DIAGNOSIS — Z113 Encounter for screening for infections with a predominantly sexual mode of transmission: Secondary | ICD-10-CM

## 2020-11-26 DIAGNOSIS — Z01419 Encounter for gynecological examination (general) (routine) without abnormal findings: Secondary | ICD-10-CM | POA: Diagnosis not present

## 2020-11-26 LAB — CBC
HCT: 38.5 % (ref 35.0–45.0)
Hemoglobin: 13.3 g/dL (ref 11.7–15.5)
MCH: 32.6 pg (ref 27.0–33.0)
MCHC: 34.5 g/dL (ref 32.0–36.0)
MCV: 94.4 fL (ref 80.0–100.0)
MPV: 9.1 fL (ref 7.5–12.5)
Platelets: 368 10*3/uL (ref 140–400)
RBC: 4.08 10*6/uL (ref 3.80–5.10)
RDW: 11.4 % (ref 11.0–15.0)
WBC: 6.3 10*3/uL (ref 3.8–10.8)

## 2020-11-26 NOTE — Progress Notes (Signed)
55 y.o. G64P2012 Divorced Asian/Caucacian female here for annual exam.    Uncertain if she had a cycle in July, 2022 but could not find documentation of this.  Spotting 09-27-20. Prior LMP was January, 2022.  Before this her menses were in 2021.   Had partner the beginning of the year.   Noticing dry mouth.  No dry eyes.  Having leg cramps.   No hot flashes.   PCP:   Kathyrn Lass, MD  Patient's last menstrual period was 09/27/2020 (approximate).           Sexually active: No.  The current method of family planning is tubal ligation.    Exercising: No.   Does do some walking Smoker:  no  Health Maintenance: Pap: 10-29-15 Neg:Neg HR HPV,  08-30-12 Neg:Neg HR HPV  History of abnormal Pap:  Yes, ASCUS 1994. Colpo and cryotherapy to cervix    MMG: 01-18-20 3D/Neg/BiRads1 Colonoscopy:  04-22-19 normal;next 5 years d/t hx of colon polyps BMD:   n/a  Result  n/a TDaP:  11-03-16 Gardasil:   no HIV: 11-22-19 NR Hep C: 11-22-19 Neg Screening Labs: today Flu vaccine:  on the fence about this   reports that she has never smoked. She has never used smokeless tobacco. She reports current alcohol use of about 2.0 - 3.0 standard drinks per week. She reports that she does not use drugs.  Past Medical History:  Diagnosis Date   Allergy    Anemia    Depression 2004   History - situational, no meds   Encounter for insertion of mirena IUD 08/04/08   Enlarged thyroid    being monitored yearly   History of COVID-19 02/2019   Hx of adenomatous polyp of colon 01/23/2016   Multiple thyroid nodules    being monitored yearly   Seasonal allergies    Seasonal allergies    SVD (spontaneous vaginal delivery)    x 2    Past Surgical History:  Procedure Laterality Date   APPENDECTOMY  1988   COLONOSCOPY  04/22/2019   COLONOSCOPY  01/04/2016   COLPOSCOPY  1994   ASCUS   DILATION AND CURETTAGE OF UTERUS N/A 06/13/2014   Procedure: DILATATION AND CURETTAGE;  Surgeon: Nunzio Cobbs, MD;   Location: [facility];  Service: Gynecology;  Laterality: N/A;   HYSTEROSCOPY N/A 06/13/2014   Procedure: HYSTEROSCOPY WITH NOVASURE ABLATION and removal of malpositioned IUD ;  Surgeon: Nunzio Cobbs, MD;  Location: [facility];  Service: Gynecology;  Laterality: N/A;   LAPAROSCOPIC TUBAL LIGATION N/A 06/13/2014   Procedure: LAPAROSCOPIC BILATERAL TUBAL LIGATION WITH CAUTERY ;  Surgeon: Nunzio Cobbs, MD;  Location: [facility];  Service: Gynecology;  Laterality: N/A;   thyroid biopsy  10/11   hyplerplastic thyroid nodule- multinodular goiter   TUBAL LIGATION  06-13-14   WISDOM TOOTH EXTRACTION      Current Outpatient Medications  Medication Sig Dispense Refill   Ibuprofen (ADVIL) 200 MG CAPS Take by mouth.     methocarbamol (ROBAXIN) 500 MG tablet Take 1 tablet by mouth as needed. c     naproxen (NAPROSYN) 500 MG tablet Take 500 mg by mouth 2 (two) times daily as needed. Reported on 02/26/2015  2   No current facility-administered medications for this visit.    Family History  Problem Relation Age of Onset   Hypertension Mother    Hypertension Father    Colon polyps Father    Colon cancer Neg Hx  Rectal cancer Neg Hx    Esophageal cancer Neg Hx    Stomach cancer Neg Hx     Review of Systems  Constitutional:        Leg cramps  HENT:  Positive for hearing loss.   All other systems reviewed and are negative.  Exam:   BP 110/68   Pulse 67   Ht 5\' 7"  (1.702 m)   Wt 176 lb (79.8 kg)   LMP 09/27/2020 (Approximate) Comment: spotting only  SpO2 99%   BMI 27.57 kg/m     General appearance: alert, cooperative and appears stated age Head: normocephalic, without obvious abnormality, atraumatic Neck: no adenopathy, supple, symmetrical, trachea midline and thyroid normal to inspection and palpation Lungs: clear to auscultation bilaterally Breasts: normal appearance, no masses or tenderness, No nipple retraction or dimpling, No nipple discharge or bleeding, No axillary  adenopathy Heart: regular rate and rhythm Abdomen: soft, non-tender; no masses, no organomegaly Extremities: extremities normal, atraumatic, no cyanosis or edema Skin: skin color, texture, turgor normal. No rashes or lesions Lymph nodes: cervical, supraclavicular, and axillary nodes normal. Neurologic: grossly normal  Pelvic: External genitalia:  no lesions              No abnormal inguinal nodes palpated.              Urethra:  normal appearing urethra with no masses, tenderness or lesions              Bartholins and Skenes: normal                 Vagina: normal appearing vagina with normal color and discharge, no lesions              Cervix: no lesions              Pap taken: yes Bimanual Exam:  Uterus:  normal size, contour, position, consistency, mobility, non-tender              Adnexa: no mass, fullness, tenderness              Rectal exam: yes.  Confirms.              Anus:  normal sphincter tone, no lesions  Chaperone was present for exam:  Estill Bamberg, CMA  Assessment:   Well woman visit with gynecologic exam. Perimenopausal female.  Remote history of cryotherapy to cervix.  Enlarged thyroid monitored by Dr. Chalmers Cater.   Plan: Mammogram screening discussed. Self breast awareness reviewed. Pap and HR HPV collected. Guidelines for Calcium, Vitamin D, regular exercise program including cardiovascular and weight bearing exercise. STD screening.  Routine labs.  Follow up annually and prn.   After visit summary provided.

## 2020-11-26 NOTE — Patient Instructions (Signed)

## 2020-11-28 LAB — COMPREHENSIVE METABOLIC PANEL
AG Ratio: 1.8 (calc) (ref 1.0–2.5)
ALT: 19 U/L (ref 6–29)
AST: 18 U/L (ref 10–35)
Albumin: 4.5 g/dL (ref 3.6–5.1)
Alkaline phosphatase (APISO): 52 U/L (ref 37–153)
BUN: 13 mg/dL (ref 7–25)
CO2: 25 mmol/L (ref 20–32)
Calcium: 9.5 mg/dL (ref 8.6–10.4)
Chloride: 104 mmol/L (ref 98–110)
Creat: 0.78 mg/dL (ref 0.50–1.03)
Globulin: 2.5 g/dL (calc) (ref 1.9–3.7)
Glucose, Bld: 96 mg/dL (ref 65–99)
Potassium: 4.2 mmol/L (ref 3.5–5.3)
Sodium: 137 mmol/L (ref 135–146)
Total Bilirubin: 0.2 mg/dL (ref 0.2–1.2)
Total Protein: 7 g/dL (ref 6.1–8.1)

## 2020-11-28 LAB — LIPID PANEL
Cholesterol: 184 mg/dL (ref <200)
HDL: 54 mg/dL (ref >=50)
LDL Cholesterol (Calc): 85 mg/dL (calc)
Non-HDL Cholesterol (Calc): 130 mg/dL (calc) — ABNORMAL HIGH (ref <130)
Total CHOL/HDL Ratio: 3.4 (calc) (ref <5.0)
Triglycerides: 337 mg/dL — ABNORMAL HIGH (ref <150)

## 2020-11-28 LAB — HIV ANTIBODY (ROUTINE TESTING W REFLEX): HIV 1&2 Ab, 4th Generation: NONREACTIVE

## 2020-11-28 LAB — HEPATITIS B SURFACE ANTIGEN: Hepatitis B Surface Ag: NONREACTIVE

## 2020-11-28 LAB — FLUORESCENT TREPONEMAL AB(FTA)-IGG-BLD: Fluorescent Treponemal ABS: NONREACTIVE

## 2020-11-28 LAB — RPR: RPR Ser Ql: REACTIVE — AB

## 2020-11-28 LAB — RPR TITER: RPR Titer: 1:1 {titer} — ABNORMAL HIGH

## 2020-11-28 LAB — HEPATITIS C ANTIBODY
Hepatitis C Ab: NONREACTIVE
SIGNAL TO CUT-OFF: 0.1 (ref <1.00)

## 2020-11-29 LAB — CYTOLOGY - PAP
Chlamydia: NEGATIVE
Comment: NEGATIVE
Comment: NEGATIVE
Comment: NEGATIVE
Comment: NORMAL
Diagnosis: NEGATIVE
High risk HPV: NEGATIVE
Neisseria Gonorrhea: NEGATIVE
Trichomonas: NEGATIVE

## 2021-01-29 ENCOUNTER — Encounter: Payer: Self-pay | Admitting: Obstetrics and Gynecology

## 2021-07-18 IMAGING — US US THYROID
1 series · 13 of 25 positions shown · non-contrast
Comparison: 10/30/2016

CLINICAL DATA: Thyroid nodule follow-up

EXAM:
THYROID ULTRASOUND
TECHNIQUE: Ultrasound examination of the thyroid gland and adjacent soft
tissues was performed.

[Series 1: us thyroid · 0.06mm/px · 13 of 44 slices shown]
[im 1/44]
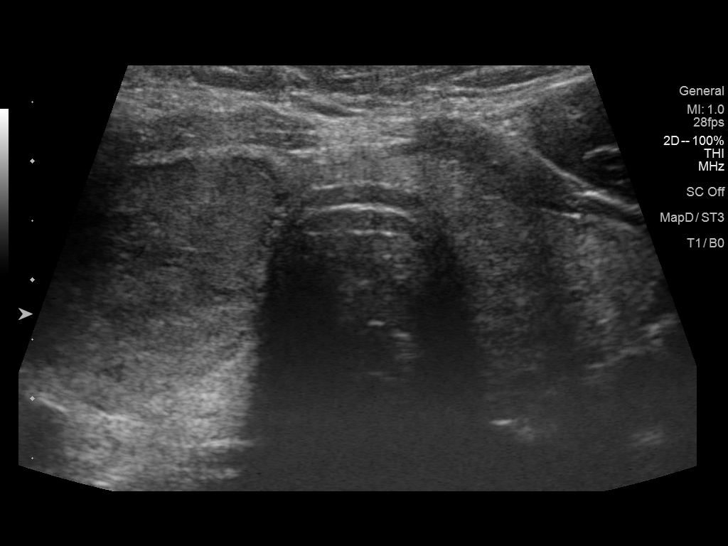
[im 4/44]
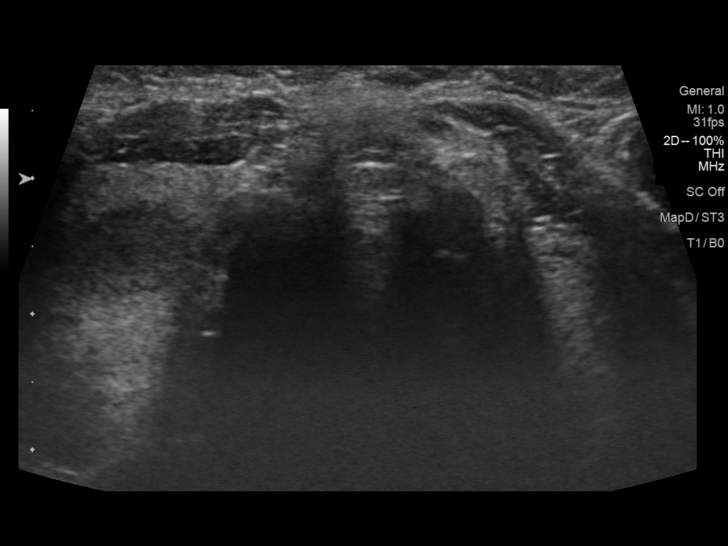
[im 8/44]
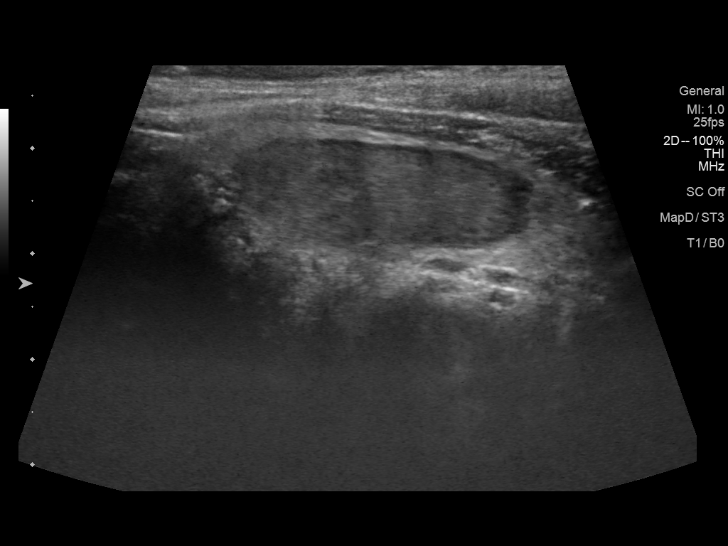
[im 11/44]
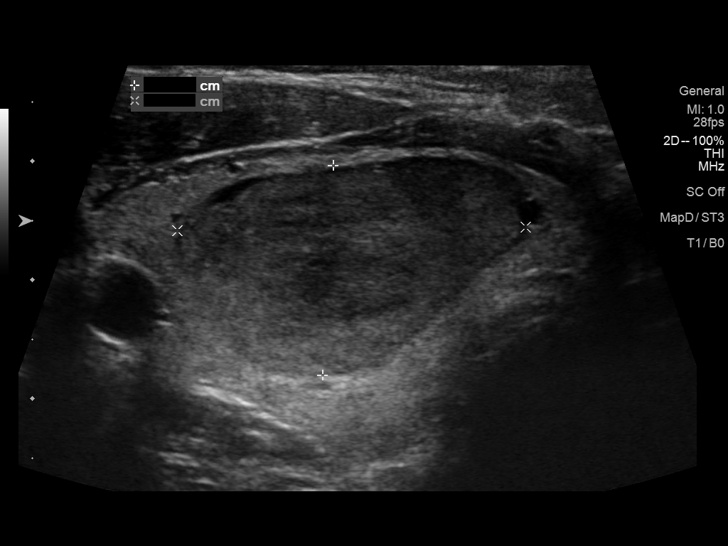
[im 15/44]
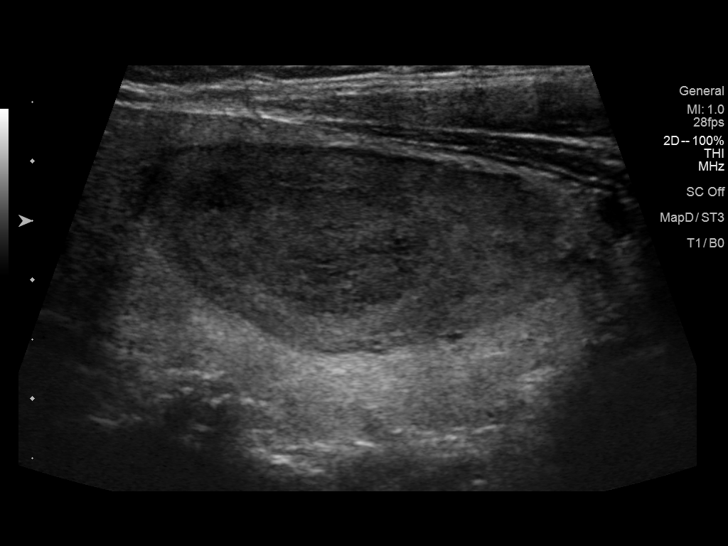
[im 18/44]
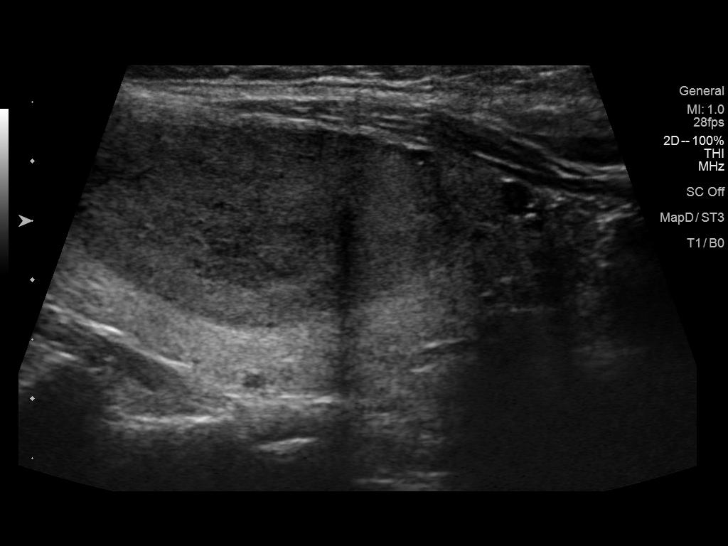
[im 22/44]
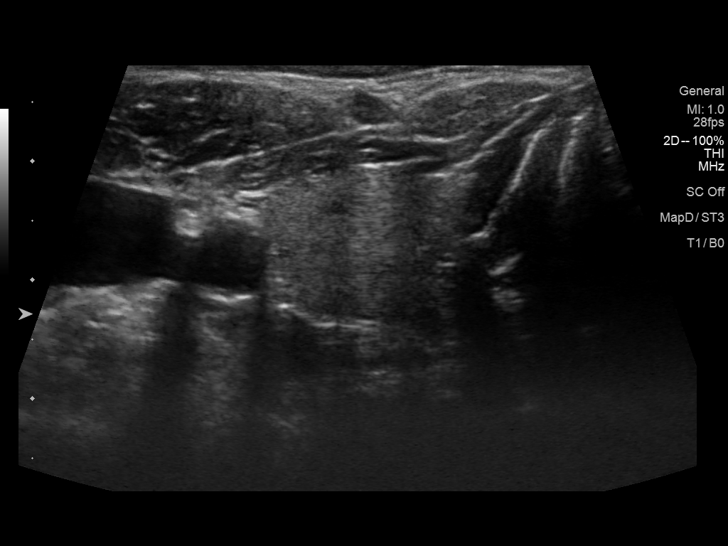
[im 26/44]
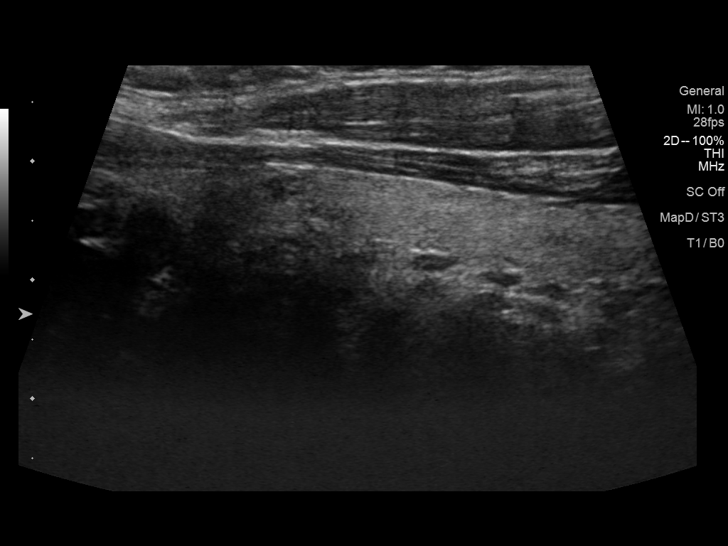
[im 29/44]
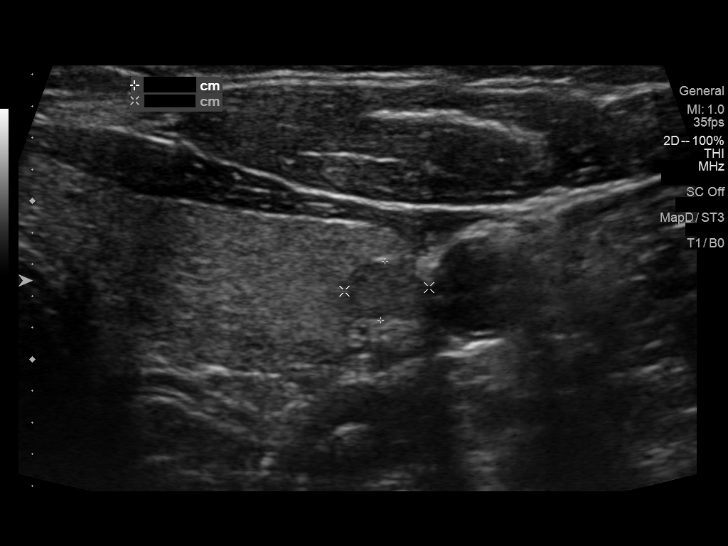
[im 33/44]
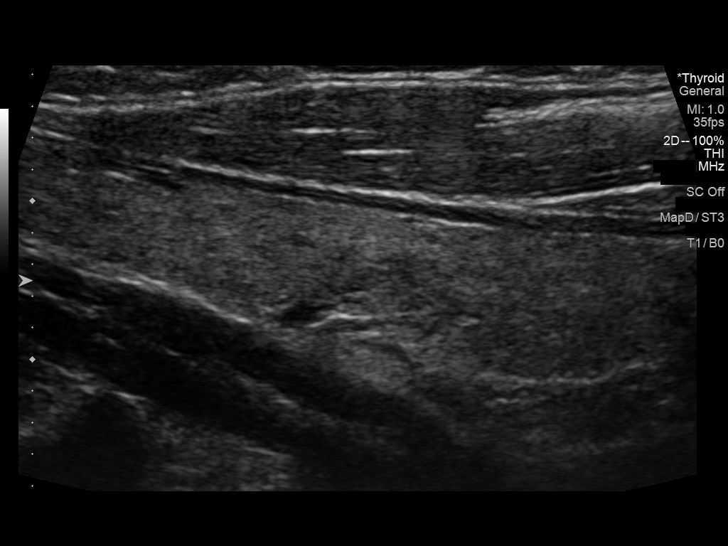
[im 36/44]
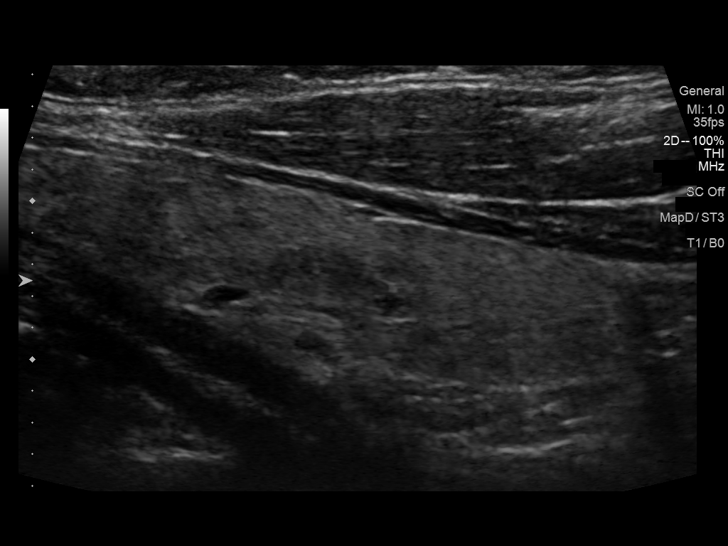
[im 40/44]
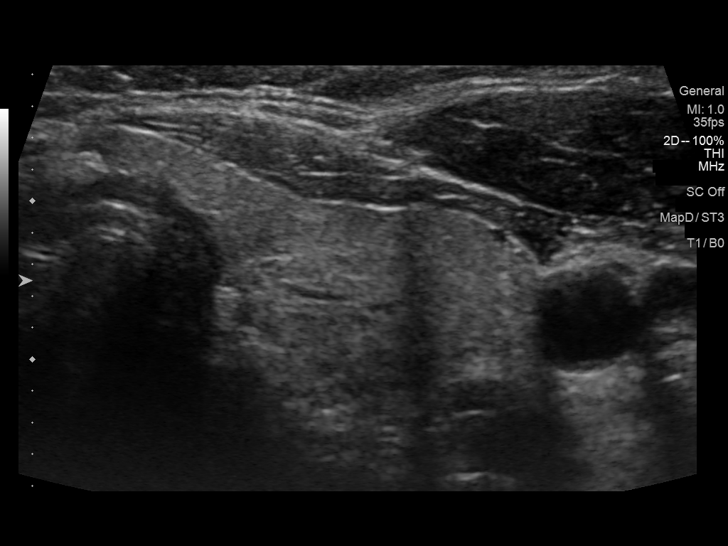
[im 44/44]
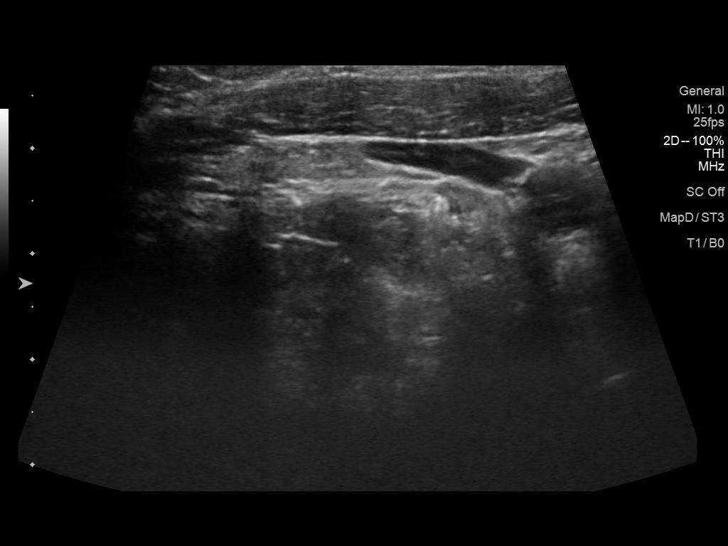

[13 of 25 positions shown; findings below may reference images not displayed]

FINDINGS: Parenchymal Echotexture: Mildly heterogenous

Isthmus: 0.4 cm

Right lobe: 6 x 2 x 2.9 cm

Left lobe: 6 x 1.5 x 2.1 cm

_________________________________________________________

Estimated total number of nodules >/= 1 cm: 1

Number of spongiform nodules >/=  2 cm not described below (TR1): 0

Number of mixed cystic and solid nodules >/= 1.5 cm not described
below (TR2): 0

_________________________________________________________

Nodule # 1:

Prior biopsy: Yes

Location: Right; Mid

Maximum size: 3.6 cm; Other 2 dimensions: 2.9 x 1.8 cm, previously,
3.8 x 2.8 x 1.6 cm

Composition: solid/almost completely solid (2)

Echogenicity: isoechoic (1)

Shape: not taller-than-wide (0)

Margins: smooth (0)

Echogenic foci: none (0)

ACR TI-RADS total points: 3.

ACR TI-RADS risk category:  TR3 (3 points).

Significant change in size (>/= 20% in two dimensions and minimal
increase of 2 mm): No

Change in features: No

Change in ACR TI-RADS risk category: No

ACR TI-RADS recommendations:

While this nodule meets criteria for FNA, it has been reportedly
biopsied multiple times in the past. This nodule remains stable from
prior study.

_________________________________________________________

Nodule # 2:

Prior biopsy: No

Location: Left; Inferior

Maximum size: 0.8 cm; Other 2 dimensions: 0.5 x 0.4 cm, previously,
0.8 x 0.5 x 0.4 cm

Composition: solid/almost completely solid (2)

Echogenicity: isoechoic (1)

Shape: not taller-than-wide (0)

Margins: smooth (0)

Echogenic foci: none (0)

ACR TI-RADS total points: 3.

ACR TI-RADS risk category:  TR3 (3 points).

Significant change in size (>/= 20% in two dimensions and minimal
increase of 2 mm): No

Change in features: No

Change in ACR TI-RADS risk category: No

ACR TI-RADS recommendations:

Given size (<1.4 cm) and appearance, this nodule does NOT meet
TI-RADS criteria for biopsy or dedicated follow-up.

_________________________________________________________
IMPRESSION: Multinodular goiter again identified. There are 2 distinct thyroid
nodules that demonstrate stability from prior study. The dominant
thyroid nodule on the right has been reportedly biopsied in the
past. Correlation with biopsy results is recommended. The left-sided
thyroid nodule does not meet criteria for follow-up or FNA.

The above is in keeping with the ACR TI-RADS recommendations - [HOSPITAL] 6313;[DATE].

## 2021-11-27 ENCOUNTER — Encounter: Payer: Self-pay | Admitting: Obstetrics and Gynecology

## 2021-11-27 ENCOUNTER — Ambulatory Visit (INDEPENDENT_AMBULATORY_CARE_PROVIDER_SITE_OTHER): Payer: BC Managed Care – PPO | Admitting: Obstetrics and Gynecology

## 2021-11-27 ENCOUNTER — Other Ambulatory Visit (HOSPITAL_COMMUNITY)
Admission: RE | Admit: 2021-11-27 | Discharge: 2021-11-27 | Disposition: A | Discharge status: Home / Self Care | Payer: BC Managed Care – PPO | Source: Ambulatory Visit | Attending: Obstetrics and Gynecology | Admitting: Obstetrics and Gynecology

## 2021-11-27 VITALS — BP 130/78 | HR 78 | Ht 66.0 in | Wt 174.0 lb

## 2021-11-27 DIAGNOSIS — Z01419 Encounter for gynecological examination (general) (routine) without abnormal findings: Secondary | ICD-10-CM

## 2021-11-27 DIAGNOSIS — Z113 Encounter for screening for infections with a predominantly sexual mode of transmission: Secondary | ICD-10-CM | POA: Insufficient documentation

## 2021-11-27 DIAGNOSIS — Z114 Encounter for screening for human immunodeficiency virus [HIV]: Secondary | ICD-10-CM

## 2021-11-27 DIAGNOSIS — Z0000 Encounter for general adult medical examination without abnormal findings: Secondary | ICD-10-CM

## 2021-11-27 DIAGNOSIS — Z1159 Encounter for screening for other viral diseases: Secondary | ICD-10-CM

## 2021-11-27 NOTE — Patient Instructions (Addendum)
EXERCISE AND DIET:  We recommended that you start or continue a regular exercise program for good health. Regular exercise means any activity that makes your heart beat faster and makes you sweat.  We recommend exercising at least 30 minutes per day at least 3 days a week, preferably 4 or 5.  We also recommend a diet low in fat and sugar.  Inactivity, poor dietary choices and obesity can cause diabetes, heart attack, stroke, and kidney damage, among others.    ALCOHOL AND SMOKING:  Women should limit their alcohol intake to no more than 7 drinks/beers/glasses of wine (combined, not each!) per week. Moderation of alcohol intake to this level decreases your risk of breast cancer and liver damage. And of course, no recreational drugs are part of a healthy lifestyle.  And absolutely no smoking or even second hand smoke. Most people know smoking can cause heart and lung diseases, but did you know it also contributes to weakening of your bones? Aging of your skin?  Yellowing of your teeth and nails?  CALCIUM AND VITAMIN D:  Adequate intake of calcium and Vitamin D are recommended.  The recommendations for exact amounts of these supplements seem to change often, but generally speaking 600 mg of calcium (either carbonate or citrate) and 800 units of Vitamin D per day seems prudent. Certain women may benefit from higher intake of Vitamin D.  If you are among these women, your doctor will have told you during your visit.    PAP SMEARS:  Pap smears, to check for cervical cancer or precancers,  have traditionally been done yearly, although recent scientific advances have shown that most women can have pap smears less often.  However, every woman still should have a physical exam from her gynecologist every year. It will include a breast check, inspection of the vulva and vagina to check for abnormal growths or skin changes, a visual exam of the cervix, and then an exam to evaluate the size and shape of the uterus and  ovaries.  And after 56 years of age, a rectal exam is indicated to check for rectal cancers. We will also provide age appropriate advice regarding health maintenance, like when you should have certain vaccines, screening for sexually transmitted diseases, bone density testing, colonoscopy, mammograms, etc.   MAMMOGRAMS:  All women over 40 years old should have a yearly mammogram. Many facilities now offer a "3D" mammogram, which may cost around $50 extra out of pocket. If possible,  we recommend you accept the option to have the 3D mammogram performed.  It both reduces the number of women who will be called back for extra views which then turn out to be normal, and it is better than the routine mammogram at detecting truly abnormal areas.    COLONOSCOPY:  Colonoscopy to screen for colon cancer is recommended for all women at age 50.  We know, you hate the idea of the prep.  We agree, BUT, having colon cancer and not knowing it is worse!!  Colon cancer so often starts as a polyp that can be seen and removed at colonscopy, which can quite literally save your life!  And if your first colonoscopy is normal and you have no family history of colon cancer, most women don't have to have it again for 10 years.  Once every ten years, you can do something that may end up saving your life, right?  We will be happy to help you get it scheduled when you are ready.    Be sure to check your insurance coverage so you understand how much it will cost.  It may be covered as a preventative service at no cost, but you should check your particular policy.    Leg Cramps Leg cramps occur when one or more muscles tighten and a person has no control over it (involuntary muscle contraction). Muscle cramps are most common in the calf muscles of the leg. They can occur during exercise or at rest. Leg cramps are painful, and they may last for a few seconds to a few minutes. Cramps may return several times before they finally stop. Usually,  leg cramps are not caused by a serious medical problem. In many cases, the cause is not known. Some common causes include: Excessive physical effort (overexertion), such as during intense exercise. Doing the same motion over and over. Staying in a certain position for a long period of time. Improper preparation, form, or technique while doing a sport or an activity. Dehydration. Injury. Side effects of certain medicines. Abnormally low levels of minerals in your blood (electrolytes), especially potassium and calcium. This could result from: Pregnancy. Taking diuretic medicines. Follow these instructions at home: Eating and drinking Drink enough fluid to keep your urine pale yellow. Staying hydrated may help prevent cramps. Eat a healthy diet that includes plenty of nutrients to help your muscles function. A healthy diet includes fruits and vegetables, lean protein, whole grains, and low-fat or nonfat dairy products. Managing pain, stiffness, and swelling     Try massaging, stretching, and relaxing the affected muscle. Do this for several minutes at a time. If directed, put ice on areas that are sore or painful after a cramp. To do this: Put ice in a plastic bag. Place a towel between your skin and the bag. Leave the ice on for 20 minutes, 2-3 times a day. Remove the ice if your skin turns bright red. This is very important. If you cannot feel pain, heat, or cold, you have a greater risk of damage to the area. If directed, apply heat to muscles that are tense or tight. Do this before you exercise, or as often as told by your health care provider. Use the heat source that your health care provider recommends, such as a moist heat pack or a heating pad. To do this: Place a towel between your skin and the heat source. Leave the heat on for 20-30 minutes. Remove the heat if your skin turns bright red. This is especially important if you are unable to feel pain, heat, or cold. You may have a  greater risk of getting burned. Try taking hot showers or baths to help relax tight muscles. General instructions If you are having frequent leg cramps, avoid intense exercise for several days. Take over-the-counter and prescription medicines only as told by your health care provider. Keep all follow-up visits. This is important. Contact a health care provider if: Your leg cramps get more severe or more frequent, or they do not improve over time. Your foot becomes cold, numb, or blue. Summary Muscle cramps can develop in any muscle, but the most common place is in the calf muscles of the leg. Leg cramps are painful, and they may last for a few seconds to a few minutes. Usually, leg cramps are not caused by a serious medical problem. Often, the cause is not known. Stay hydrated, and take over-the-counter and prescription medicines only as told by your health care provider. This information is not intended to replace advice  given to you by your health care provider. Make sure you discuss any questions you have with your health care provider. Document Revised: 06/01/2019 Document Reviewed: 06/01/2019 Elsevier Patient Education  [place].

## 2021-11-27 NOTE — Progress Notes (Unsigned)
56 y.o. [id number] Divorced. Race:two or more races.female here for annual exam.    Menses every month this year, except September and October, 2023.  Some hot flashes at night.   Having leg cramps.  Saw her PCP.   Step mother passed.  Daughter moved out.   PCP:   Kathyrn Lass, MD     Sexually active: Yes.    The current method of family planning is post menopausal status.    Exercising: No.   Smoker:  no  Health Maintenance: Pap:  11/26/2020 neg,neg hpv,10-29-15 Neg:Neg HR HPV,  08-30-12 Neg:Neg HR HPV  History of abnormal Pap:  Yes, ASCUS 1994. Colpo and cryotherapy to cervix    MMG:  12/28/2022I-RADS CAT 1 NEG Colonoscopy:  04/22/2019 - due in 2026. BMD:  N/A TDaP:  11/03/2016 Gardasil:   no HIV: neg 11/26/20 Hep C:  neg 11/26/20 Screening Labs:  PCP   reports that she has never smoked. She has never used smokeless tobacco. She reports current alcohol use of about 2.0 - 3.0 standard drinks of alcohol per week. She reports that she does not use drugs.  Past Medical History:  Diagnosis Date   Allergy    Anemia    Depression 2004   History - situational, no meds   Encounter for insertion of mirena IUD 08/04/08   Enlarged thyroid    being monitored yearly   History of COVID-19 02/2019   Hx of adenomatous polyp of colon 01/23/2016   Multiple thyroid nodules    being monitored yearly   Seasonal allergies    Seasonal allergies    SVD (spontaneous vaginal delivery)    x 2    Past Surgical History:  Procedure Laterality Date   APPENDECTOMY  1988   COLONOSCOPY  04/22/2019   COLONOSCOPY  01/04/2016   COLPOSCOPY  1994   ASCUS   DILATION AND CURETTAGE OF UTERUS N/A 06/13/2014   Procedure: DILATATION AND CURETTAGE;  Surgeon: Nunzio Cobbs, MD;  Location: [facility];  Service: Gynecology;  Laterality: N/A;   HYSTEROSCOPY N/A 06/13/2014   Procedure: HYSTEROSCOPY WITH NOVASURE ABLATION and removal of malpositioned IUD ;  Surgeon: Nunzio Cobbs, MD;  Location: [facility];  Service: Gynecology;  Laterality: N/A;   LAPAROSCOPIC TUBAL LIGATION N/A 06/13/2014   Procedure: LAPAROSCOPIC BILATERAL TUBAL LIGATION WITH CAUTERY ;  Surgeon: Nunzio Cobbs, MD;  Location: [facility];  Service: Gynecology;  Laterality: N/A;   thyroid biopsy  10/11   hyplerplastic thyroid nodule- multinodular goiter   TUBAL LIGATION  06-13-14   WISDOM TOOTH EXTRACTION      Current Outpatient Medications  Medication Sig Dispense Refill   Ibuprofen 200 MG CAPS Take by mouth.     methocarbamol (ROBAXIN) 500 MG tablet Take 1 tablet by mouth as needed. c     naproxen (NAPROSYN) 500 MG tablet Take 500 mg by mouth 2 (two) times daily as needed. Reported on 02/26/2015  2   No current facility-administered medications for this visit.    Family History  Problem Relation Age of Onset   Hypertension Mother    Hypertension Father    Colon polyps Father    Colon cancer Neg Hx    Rectal cancer Neg Hx    Esophageal cancer Neg Hx    Stomach cancer Neg Hx     Review of Systems  All other systems reviewed and are negative.   Exam:   BP 130/78 (BP Location: Left Arm,  Patient Position: Sitting, Cuff Size: Normal)   Pulse 78   Ht '5\' 6"'$  (1.676 m)   Wt 174 lb (78.9 kg)   LMP 08/27/2021 (Approximate)   BMI 28.08 kg/m     General appearance: alert, cooperative and appears stated age Head: normocephalic, without obvious abnormality, atraumatic Neck: no adenopathy, supple, symmetrical, trachea midline and thyroid normal to inspection and palpation Lungs: clear to auscultation bilaterally Breasts: normal appearance, no masses or tenderness, No nipple retraction or dimpling, No nipple discharge or bleeding, No axillary adenopathy Heart: regular rate and rhythm Abdomen: soft, non-tender; no masses, no organomegaly Extremities: extremities normal, atraumatic, no cyanosis or edema Skin: skin color, texture, turgor normal. No rashes or lesions Lymph nodes: cervical, supraclavicular, and  axillary nodes normal. Neurologic: grossly normal  Pelvic: External genitalia:  no lesions              No abnormal inguinal nodes palpated.              Urethra:  normal appearing urethra with no masses, tenderness or lesions              Bartholins and Skenes: normal                 Vagina: normal appearing vagina with normal color and discharge, no lesions              Cervix: no lesions              Pap taken: {yes no:314532} Bimanual Exam:  Uterus:  normal size, contour, position, consistency, mobility, non-tender              Adnexa: no mass, fullness, tenderness              Rectal exam: {yes no:314532}.  Confirms.              Anus:  normal sphincter tone, no lesions  Chaperone was present for exam:  ***  Assessment:   Well woman visit with gynecologic exam. Remote history of cryotherapy to cervix.  Enlarged thyroid monitored by Dr. Chalmers Cater.  Leg cramps.   Plan: Mammogram screening discussed. Self breast awareness reviewed. Pap and HR HPV as above. Guidelines for Calcium, Vitamin D, regular exercise program including cardiovascular and weight bearing exercise.   Follow up annually and prn.   Additional counseling given.  {yes [id number]. _______ minutes face to face time of which over 50% was spent in counseling.    After visit summary provided.

## 2021-11-28 LAB — HEPATITIS C ANTIBODY: Hepatitis C Ab: NONREACTIVE

## 2021-11-28 LAB — COMPREHENSIVE METABOLIC PANEL
AG Ratio: 1.6 (calc) (ref 1.0–2.5)
ALT: 23 U/L (ref 6–29)
AST: 20 U/L (ref 10–35)
Albumin: 4.4 g/dL (ref 3.6–5.1)
Alkaline phosphatase (APISO): 52 U/L (ref 37–153)
BUN: 16 mg/dL (ref 7–25)
CO2: 25 mmol/L (ref 20–32)
Calcium: 9.4 mg/dL (ref 8.6–10.4)
Chloride: 107 mmol/L (ref 98–110)
Creat: 0.69 mg/dL (ref 0.50–1.03)
Globulin: 2.8 g/dL (calc) (ref 1.9–3.7)
Glucose, Bld: 80 mg/dL (ref 65–99)
Potassium: 4.1 mmol/L (ref 3.5–5.3)
Sodium: 142 mmol/L (ref 135–146)
Total Bilirubin: 0.4 mg/dL (ref 0.2–1.2)
Total Protein: 7.2 g/dL (ref 6.1–8.1)

## 2021-11-28 LAB — HIV ANTIBODY (ROUTINE TESTING W REFLEX): HIV 1&2 Ab, 4th Generation: NONREACTIVE

## 2021-11-28 LAB — CBC
HCT: 39 % (ref 35.0–45.0)
Hemoglobin: 13.7 g/dL (ref 11.7–15.5)
MCH: 32.7 pg (ref 27.0–33.0)
MCHC: 35.1 g/dL (ref 32.0–36.0)
MCV: 93.1 fL (ref 80.0–100.0)
MPV: 9.1 fL (ref 7.5–12.5)
Platelets: 342 10*3/uL (ref 140–400)
RBC: 4.19 10*6/uL (ref 3.80–5.10)
RDW: 11.3 % (ref 11.0–15.0)
WBC: 5.4 10*3/uL (ref 3.8–10.8)

## 2021-11-28 LAB — CERVICOVAGINAL ANCILLARY ONLY
Chlamydia: NEGATIVE
Comment: NEGATIVE
Comment: NEGATIVE
Comment: NORMAL
Neisseria Gonorrhea: NEGATIVE
Trichomonas: NEGATIVE

## 2021-11-28 LAB — RPR: RPR Ser Ql: NONREACTIVE

## 2021-11-28 LAB — LIPID PANEL
Cholesterol: 217 mg/dL — ABNORMAL HIGH (ref <200)
HDL: 59 mg/dL (ref >=50)
LDL Cholesterol (Calc): 118 mg/dL (calc) — ABNORMAL HIGH
Non-HDL Cholesterol (Calc): 158 mg/dL (calc) — ABNORMAL HIGH (ref <130)
Total CHOL/HDL Ratio: 3.7 (calc) (ref <5.0)
Triglycerides: 282 mg/dL — ABNORMAL HIGH (ref <150)

## 2021-12-02 ENCOUNTER — Encounter: Payer: Self-pay | Admitting: Obstetrics and Gynecology

## 2022-01-28 ENCOUNTER — Encounter: Payer: Self-pay | Admitting: Obstetrics and Gynecology

## 2022-01-30 ENCOUNTER — Telehealth: Payer: Self-pay | Admitting: Obstetrics and Gynecology

## 2022-01-30 NOTE — Telephone Encounter (Signed)
See mammogram result note.   Please place in mammogram hold.

## 2022-01-31 NOTE — Telephone Encounter (Signed)
01/24/22 MMG report reviewed.  Placed in [facility] hold.   Encounter closed.

## 2022-02-07 ENCOUNTER — Encounter: Payer: Self-pay | Admitting: Obstetrics and Gynecology

## 2022-02-10 ENCOUNTER — Other Ambulatory Visit: Payer: Self-pay | Admitting: Radiology

## 2022-02-12 ENCOUNTER — Telehealth: Payer: Self-pay | Admitting: Hematology and Oncology

## 2022-02-12 ENCOUNTER — Encounter: Payer: Self-pay | Admitting: Obstetrics and Gynecology

## 2022-02-12 NOTE — Telephone Encounter (Signed)
Spoke to patient to confirm upcoming morning [facility] clinic appointment on 1/24, paperwork will be sent via [facility].   Gave location and time, also informed patient that the surgeon's office would be calling as well to get information from them similar to the packet that they will be receiving so make sure to do both.  Reminded patient that all providers will be coming to the clinic to see them HERE and if they had any questions to not hesitate to reach back out to myself or their navigators.

## 2022-02-13 ENCOUNTER — Telehealth: Payer: Self-pay | Admitting: Obstetrics and Gynecology

## 2022-02-13 NOTE — Telephone Encounter (Signed)
Phone call to patient now to give a message of support for her new dx of left breast cancer.   I encouraged her that she is going to receive great care and than I am available if she needs anything from me.   Ok to remove from mammogram hold after seen for her first visit at the multidisciplinary breast cancer clinic on 02/19/22.

## 2022-02-17 ENCOUNTER — Ambulatory Visit: Payer: Self-pay | Admitting: Surgery

## 2022-02-17 ENCOUNTER — Encounter: Payer: Self-pay | Admitting: *Deleted

## 2022-02-17 DIAGNOSIS — C50212 Malignant neoplasm of upper-inner quadrant of left female breast: Secondary | ICD-10-CM

## 2022-02-17 DIAGNOSIS — C50912 Malignant neoplasm of unspecified site of left female breast: Secondary | ICD-10-CM

## 2022-02-17 DIAGNOSIS — N6489 Other specified disorders of breast: Secondary | ICD-10-CM | POA: Insufficient documentation

## 2022-02-17 NOTE — Progress Notes (Signed)
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   HISTORY OF PRESENT ILLNESS: Chelsea [name] is a 57 y.o. female seen in the multidisciplinary breast clinic for a new diagnosis of left breast cancer. The patient was found on screening mammogram to have an oval mass in the right breast as well as architectural distortion in the left breast on screening mammography in December 2023.  Diagnostic mammography of both breasts on 02/04/2022 showed a 6 mm oval mass in the 3 o'clock position, and a 6 mm mass in the 1 o'clock position both of the sites were in the right breast, the architectural distortion in the left breast measured 1.2 cm in the upper outer quadrant.  By ultrasound in the right breast there was a 5 mm mass in the 3 o'clock position, and a a 6 mm mass in the 1:00 position. The right axilla was negative for adenopathy. The left breast had an 8 mm mass in the 1:00 position and the left axilla was negative for adenopathy.   A biopsy on 02/10/22 showed a complex sclerosing lesion in the right 3:00 biopsy, and benign breast parenchyma with pseudo and adenomatous stromal hyperplasia in the 1:00 right breast specimen.  The left breast however showed an invasive mammary carcinoma that was grade 2.  Prognostic showed ER/PR positivity, HER2 was negative and Ki-67 was 40%.  She is seen today to discuss treatment recommendations of her cancer.     PREVIOUS RADIATION THERAPY: {EXAM; YES/NO:19492::"No"}   PAST MEDICAL HISTORY:  Past Medical History:  Diagnosis Date   Allergy    Anemia    Depression 2004   History - situational, no meds   Encounter for insertion of mirena IUD  08/04/2008   Enlarged thyroid    being monitored yearly   History of COVID-19 02/2019   Hx of adenomatous polyp of colon 01/23/2016   Hypertriglyceridemia    Multiple thyroid nodules    being monitored yearly   Seasonal allergies    Seasonal allergies    SVD (spontaneous vaginal delivery)    x 2       PAST SURGICAL HISTORY: Past Surgical History:  Procedure Laterality Date   APPENDECTOMY  1988   COLONOSCOPY  04/22/2019   COLONOSCOPY  01/04/2016   COLPOSCOPY  1994   ASCUS   DILATION AND CURETTAGE OF UTERUS N/A 06/13/2014   Procedure: DILATATION AND CURETTAGE;  Surgeon: Nunzio Cobbs, MD;  Location: [facility];  Service: Gynecology;  Laterality: N/A;   HYSTEROSCOPY N/A 06/13/2014   Procedure: HYSTEROSCOPY WITH NOVASURE ABLATION and removal of malpositioned IUD ;  Surgeon: Nunzio Cobbs, MD;  Location: [facility];  Service: Gynecology;  Laterality: N/A;   LAPAROSCOPIC TUBAL LIGATION N/A 06/13/2014   Procedure: LAPAROSCOPIC BILATERAL TUBAL LIGATION WITH CAUTERY ;  Surgeon: Nunzio Cobbs, MD;  Location: [facility];  Service: Gynecology;  Laterality: N/A;   thyroid biopsy  10/11   hyplerplastic thyroid nodule- multinodular goiter   TUBAL LIGATION  06-13-14   WISDOM TOOTH EXTRACTION       FAMILY HISTORY:  Family History  Problem Relation Age  of Onset   Hypertension Mother    Hypertension Father    Colon polyps Father    Colon cancer Neg Hx    Rectal cancer Neg Hx    Esophageal cancer Neg Hx    Stomach cancer Neg Hx      SOCIAL HISTORY:  reports that she has never smoked. She has never used smokeless tobacco. She reports current alcohol use of about 2.0 - 3.0 standard drinks of alcohol per week. She reports that she does not use drugs.   ALLERGIES: Elemental sulfur, Codeine, Mupirocin, and Percocet [oxycodone-acetaminophen]   MEDICATIONS:  Current Outpatient Medications  Medication Sig Dispense Refill   Ibuprofen 200 MG CAPS Take by mouth.      methocarbamol (ROBAXIN) 500 MG tablet Take 1 tablet by mouth as needed. c     naproxen (NAPROSYN) 500 MG tablet Take 500 mg by mouth 2 (two) times daily as needed. Reported on 02/26/2015  2   No current facility-administered medications for this visit.     REVIEW OF SYSTEMS: On review of systems, the patient reports that she is doing ***     PHYSICAL EXAM:  Wt Readings from Last 3 Encounters:  11/27/21 174 lb (78.9 kg)  11/26/20 176 lb (79.8 kg)  11/22/19 169 lb (76.7 kg)   Temp Readings from Last 3 Encounters:  04/22/19 (!) 96.9 F (36.1 C) (Other (Comment))  04/08/19 (!) 97.1 F (36.2 C) (Temporal)  11/18/18 97.7 F (36.5 C) (Temporal)   BP Readings from Last 3 Encounters:  11/27/21 130/78  11/26/20 110/68  11/22/19 110/66   Pulse Readings from Last 3 Encounters:  11/27/21 78  11/26/20 67  11/22/19 86    In general this is a well appearing *** female in no acute distress. She's alert and oriented x4 and appropriate throughout the examination. Cardiopulmonary assessment is negative for acute distress and she exhibits normal effort. Bilateral breast exam is deferred.    ECOG = ***  0 - Asymptomatic (Fully active, able to carry on all predisease activities without restriction)  1 - Symptomatic but completely ambulatory (Restricted in physically strenuous activity but ambulatory and able to carry out work of a light or sedentary nature. For example, light housework, office work)  2 - Symptomatic, <50% in bed during the day (Ambulatory and capable of all self care but unable to carry out any work activities. Up and about more than 50% of waking hours)  3 - Symptomatic, >50% in bed, but not bedbound (Capable of only limited self-care, confined to bed or chair 50% or more of waking hours)  4 - Bedbound (Completely disabled. Cannot carry on any self-care. Totally confined to bed or chair)  5 - Death   Eustace Pen MM, Creech RH, Tormey DC, et al. [phone]). "Toxicity and response  criteria of the [facility]". [place] Oncol. 5 (6): 649-55    LABORATORY DATA:  Lab Results  Component Value Date   WBC 5.4 11/27/2021   HGB 13.7 11/27/2021   HCT 39.0 11/27/2021   MCV 93.1 11/27/2021   PLT 342 11/27/2021   Lab Results  Component Value Date   NA 142 11/27/2021   K 4.1 11/27/2021   CL 107 11/27/2021   CO2 25 11/27/2021   Lab Results  Component Value Date   ALT 23 11/27/2021   AST 20 11/27/2021   ALKPHOS 55 11/22/2019   BILITOT 0.4 11/27/2021      RADIOGRAPHY: No results found.  IMPRESSION/PLAN: 1. 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 Dr. Lisbeth Renshaw discusses the pathology findings and reviews the nature of early stage left breast disease. The consensus from the breast conference includes breast conservation with lumpectomy with  sentinel node biopsy. Depending on the size of the final tumor measurements rendered by pathology, the tumor may be tested for Oncotype Dx score to determine a role for systemic therapy. Provided that chemotherapy is not indicated, the patient's course would then be followed by external radiotherapy to the breast  to reduce risks of local recurrence followed by antiestrogen therapy. We discussed the risks, benefits, short, and long term effects of radiotherapy, as well as the curative intent, and the patient is interested in proceeding. Dr. Lisbeth Renshaw discusses the delivery and logistics of radiotherapy and anticipates a course of 4 to 6 1/2 weeks of radiotherapy to the left breast with deep inspiration breath hold technique. We will see her back a few weeks after surgery to discuss the simulation process and anticipate we starting radiotherapy about 4-6 weeks after surgery.  2. Right breast CSL. *** 3. Possible genetic predisposition to malignancy. The patient is a candidate for genetic testing given *** personal and family history. She will meet with our geneticist  today in clinic.   In a visit lasting *** minutes, greater than 50% of the time was spent face to face reviewing her case, as well as in preparation of, discussing, and coordinating the patient's care.  The above documentation reflects my direct findings during this shared patient visit. Please see the separate note by Dr. Lisbeth Renshaw on this date for the remainder of the patient's plan of care.    Carola Rhine, [facility]    **Disclaimer: This note was dictated with voice recognition software. Similar sounding words can inadvertently be transcribed and this note may contain transcription errors which may not have been corrected upon publication of note.**

## 2022-02-18 NOTE — Telephone Encounter (Signed)
I made a note on mammo hold to hold patient there until first  visit with onc team.

## 2022-02-19 ENCOUNTER — Inpatient Hospital Stay: Payer: BC Managed Care – PPO | Admitting: Licensed Clinical Social Worker

## 2022-02-19 ENCOUNTER — Ambulatory Visit: Payer: BC Managed Care – PPO | Attending: Surgery | Admitting: Physical Therapy

## 2022-02-19 ENCOUNTER — Encounter: Payer: Self-pay | Admitting: *Deleted

## 2022-02-19 ENCOUNTER — Encounter: Payer: Self-pay | Admitting: Physical Therapy

## 2022-02-19 ENCOUNTER — Inpatient Hospital Stay (HOSPITAL_BASED_OUTPATIENT_CLINIC_OR_DEPARTMENT_OTHER): Payer: BC Managed Care – PPO | Admitting: Hematology and Oncology

## 2022-02-19 ENCOUNTER — Ambulatory Visit
Admission: RE | Admit: 2022-02-19 | Discharge: 2022-02-19 | Disposition: A | Discharge status: Home / Self Care | Payer: BC Managed Care – PPO | Source: Ambulatory Visit | Attending: Radiation Oncology | Admitting: Radiation Oncology

## 2022-02-19 ENCOUNTER — Inpatient Hospital Stay (HOSPITAL_BASED_OUTPATIENT_CLINIC_OR_DEPARTMENT_OTHER): Payer: BC Managed Care – PPO | Admitting: Genetic Counselor

## 2022-02-19 ENCOUNTER — Other Ambulatory Visit: Payer: Self-pay

## 2022-02-19 ENCOUNTER — Ambulatory Visit: Payer: Self-pay | Admitting: Surgery

## 2022-02-19 ENCOUNTER — Encounter: Payer: Self-pay | Admitting: Hematology and Oncology

## 2022-02-19 ENCOUNTER — Inpatient Hospital Stay: Payer: BC Managed Care – PPO | Attending: Nurse Practitioner | Admitting: Internal Medicine

## 2022-02-19 VITALS — BP 149/77 | HR 66 | Temp 98.3°F | Resp 16 | Ht 66.0 in | Wt 173.8 lb

## 2022-02-19 DIAGNOSIS — C50212 Malignant neoplasm of upper-inner quadrant of left female breast: Secondary | ICD-10-CM | POA: Diagnosis not present

## 2022-02-19 DIAGNOSIS — C50412 Malignant neoplasm of upper-outer quadrant of left female breast: Secondary | ICD-10-CM | POA: Insufficient documentation

## 2022-02-19 DIAGNOSIS — Z170 Estrogen receptor positive status [ER+]: Secondary | ICD-10-CM | POA: Insufficient documentation

## 2022-02-19 DIAGNOSIS — R293 Abnormal posture: Secondary | ICD-10-CM | POA: Insufficient documentation

## 2022-02-19 LAB — CMP (CANCER CENTER ONLY)
ALT: 24 U/L (ref 0–44)
AST: 21 U/L (ref 15–41)
Albumin: 4.2 g/dL (ref 3.5–5.0)
Alkaline Phosphatase: 58 U/L (ref 38–126)
Anion gap: 7 (ref 5–15)
BUN: 12 mg/dL (ref 6–20)
CO2: 29 mmol/L (ref 22–32)
Calcium: 9.7 mg/dL (ref 8.9–10.3)
Chloride: 104 mmol/L (ref 98–111)
Creatinine: 0.81 mg/dL (ref 0.44–1.00)
GFR, Estimated: >60 mL/min (ref >60)
Glucose, Bld: 103 mg/dL — ABNORMAL HIGH (ref 70–99)
Potassium: 4.1 mmol/L (ref 3.5–5.1)
Sodium: 140 mmol/L (ref 135–145)
Total Bilirubin: 0.4 mg/dL (ref 0.3–1.2)
Total Protein: 7.5 g/dL (ref 6.5–8.1)

## 2022-02-19 LAB — CBC WITH DIFFERENTIAL (CANCER CENTER ONLY)
Abs Immature Granulocytes: 0.04 10*3/uL (ref 0.00–0.07)
Basophils Absolute: 0 10*3/uL (ref 0.0–0.1)
Basophils Relative: 1 %
Eosinophils Absolute: 0.1 10*3/uL (ref 0.0–0.5)
Eosinophils Relative: 1 %
HCT: 41.5 % (ref 36.0–46.0)
Hemoglobin: 14.4 g/dL (ref 12.0–15.0)
Immature Granulocytes: 1 %
Lymphocytes Relative: 31 %
Lymphs Abs: 1.6 10*3/uL (ref 0.7–4.0)
MCH: 32.8 pg (ref 26.0–34.0)
MCHC: 34.7 g/dL (ref 30.0–36.0)
MCV: 94.5 fL (ref 80.0–100.0)
Monocytes Absolute: 0.4 10*3/uL (ref 0.1–1.0)
Monocytes Relative: 8 %
Neutro Abs: 3 10*3/uL (ref 1.7–7.7)
Neutrophils Relative %: 58 %
Platelet Count: 311 10*3/uL (ref 150–400)
RBC: 4.39 MIL/uL (ref 3.87–5.11)
RDW: 11.2 % — ABNORMAL LOW (ref 11.5–15.5)
WBC Count: 5.1 10*3/uL (ref 4.0–10.5)
nRBC: 0 % (ref 0.0–0.2)

## 2022-02-19 LAB — GENETIC SCREENING ORDER

## 2022-02-19 NOTE — H&P (Signed)
[facility] 02/15/2022 [place] [place]   Subjective    Chief Complaint: Breast Cancer       History of Present Illness: Chelsea [name] is a 57 y.o. female who is seen today as an office consultation at the request of Dr. Chryl Heck for evaluation of Breast Cancer .     Annual screening mammogram on 01/24/2022 revealed an indeterminate mass in the right lower inner quadrant.  The left upper outer quadrant shows some architectural distortion.  Diagnostic mammogram on 02/04/2022 revealed a 0.6 cm oval mass in the right breast at 3:00 middle depth.  There is also a 0.6 cm oval mass in the right breast at 1:00 anterior depth.  The left breast revealed 1.2 cm area of architectural distortion.   Ultrasound revealed a 0.5 x 0.5 x 0.4 cm irregular mass in the right breast at 3:00 middle depth.  In the same breast at 1:00, there is a 0.6 x 0.3 x 0.5 cm mass.  The left breast revealed a 0.8 x 0.6 x 0.3 cm irregular mass at 1:00 middle depth.   She underwent biopsies of this area.  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  The right breast mass at 3:00 revealed a complex sclerosing lesion.  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       Review of Systems: A complete review of systems was obtained from the patient.  I have reviewed this information and discussed as appropriate with the patient.  See HPI as well for other ROS.   Review of Systems  Constitutional: Negative.   HENT: Negative.    Eyes: Negative.   Respiratory: Negative.    Cardiovascular: Negative.   Gastrointestinal: Negative.   Genitourinary: Negative.   Musculoskeletal: Negative.   Skin: Negative.   Neurological: Negative.   Endo/Heme/Allergies: Negative.   Psychiatric/Behavioral: Negative.          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   Codeine Other (See Comments)   Mupirocin Hives   Oxycodone-Acetaminophen Itching, Other (See Comments) and Nausea              Current Outpatient Medications on File Prior to Visit  Medication Sig Dispense Refill   methocarbamoL (ROBAXIN) 500 MG tablet Take 500 mg by mouth 4 (four) times daily        No current facility-administered medications on file prior to visit.      Family History       Family History  Problem Relation  Age of Onset   High blood pressure (Hypertension) Mother     High blood pressure (Hypertension) Father     Hyperlipidemia (Elevated cholesterol) Father          Social History       Tobacco Use  Smoking Status Never  Smokeless Tobacco Never      Social History  Social History         Socioeconomic History   Marital status: Divorced  Tobacco Use   Smoking status: Never   Smokeless tobacco: Never  Vaping Use   Vaping Use: Never used  Substance and Sexual Activity   Alcohol use: Yes      Comment: 4-5 x weekly per patient   Drug use: Never        Objective:        Physical Exam    Constitutional:  WDWN in NAD, conversant, no obvious deformities; lying in bed comfortably Eyes:  Pupils equal, round; sclera anicteric; moist conjunctiva; no lid lag HENT:  Oral mucosa moist; good dentition  Neck:  No masses palpated, trachea midline; no  thyromegaly Lungs:  CTA bilaterally; normal respiratory effort Breasts:  symmetric, no nipple changes; slight ecchymosis at the biopsy sites bilaterally; no palpable masses or lymphadenopathy on either side CV:  Regular rate and rhythm; no murmurs; extremities well-perfused with no edema Abd:  +bowel sounds, soft, non-tender, no palpable organomegaly; no palpable hernias Musc:  Normal gait; no apparent clubbing or cyanosis in extremities Lymphatic:  No palpable cervical or axillary lymphadenopathy Skin:  Warm, dry; no sign of jaundice Psychiatric - alert and oriented x 4; calm mood and affect     Labs, Imaging and Diagnostic Testing: Diagnosis 1. Breast, left, needle core biopsy, 1 o'clock, 5 cmfn - INVASIVE MAMMARY CARCINOMA, SEE NOTE - TUBULE FORMATION: SCORE 3 - NUCLEAR PLEOMORPHISM: SCORE 2 - MITOTIC COUNT: SCORE 1 - TOTAL SCORE: 6 - OVERALL GRADE: 2 - LYMPHOVASCULAR INVASION: NOT IDENTIFIED - CANCER LENGTH: 0.3 CM - CALCIFICATIONS: NOT IDENTIFIED NOTE: DR. Vic Ripper REVIEWED THE CASE AND CONCURS WITH THE INTERPRETATION. A BREAST PROGNOSTIC PROFILE (ER, PR, KI-67 AND HER2) IS PENDING AND WILL BE REPORTED IN AN ADDENDUM. SOLIS WAS NOTIFIED ON 02/11/2022. 2. Breast, right, needle core biopsy, 3 o'clock, 3 cmfn - COMPLEX SCLEROSING LESION - NO MALIGNANCY IDENTIFIED 3. Breast, right, needle core biopsy, 1 o'clock, 2 cmfn - BENIGN BREAST PARENCHYMA WITH PSEUDOANGIOMATOUS STROMAL HYPERPLASIA, AND ADENOSIS - NO MALIGNANCY IDENTIFIED Nupur Sharma M.D. Pathologist, Electronic Signature   ADDITIONAL INFORMATION: 1. Breast, left, needle core biopsy, 1 o'clock, 5 cmfn PROGNOSTIC INDICATORS Results: IMMUNOHISTOCHEMICAL AND MORPHOMETRIC ANALYSIS PERFORMED MANUALLY The tumor cells are negative for Her2 (0). Estrogen Receptor: 80%, POSITIVE, STRONG STAINING INTENSITY Progesterone Receptor: 50%, POSITIVE, STRONG STAINING INTENSITY Proliferation Marker Ki67: 40% REFERENCE RANGE ESTROGEN  RECEPTOR NEGATIVE 0% POSITIVE =>1% REFERENCE RANGE PROGESTERONE RECEPTOR NEGATIVE 0% POSITIVE =>1% All controls stained appropriately Claudette Laws MD Pathologist, Electronic Signature ( Signed 02/12/2022)     Assessment and Plan:  Diagnoses and all orders for this visit:   Malignant neoplasm of lower-outer quadrant of left breast of female, estrogen receptor positive    Complex sclerosing lesion of right breast     I spent a long time with the patient and her companion discussing treatment options.  We discussed the complex sclerosing lesion of the right breast and recommended simple lumpectomy.  We discussed surgical options for her cancer.  We discussed mastectomy versus breast conserving therapy.  She is a good candidate for  breast conserving therapy with a left breast radioactive seed localized lumpectomy and sentinel lymph node biopsy.  This will likely be followed by radiation and antiestrogens.  Oncology is planning to perform Oncotype testing on the surgical specimen.   Recommend left breast radioactive seed localized lumpectomy with sentinel lymph node biopsy, as well as right breast radioactive seed localized lumpectomy.  The surgical procedure has been discussed with the patient.  Potential risks, benefits, alternative treatments, and expected outcomes have been explained.  All of the patient's questions at this time have been answered.  The likelihood of reaching the patient's treatment goal is good.  The patient understand the proposed surgical procedure and wishes to proceed.         Darnel Mchan Jearld Adjutant, MD  02/19/2022 10:56 AM

## 2022-02-19 NOTE — Progress Notes (Signed)
[place] Work  Initial Assessment   Chelsea [name] is a 57 y.o. year old female accompanied by sister, Verdis Frederickson. Clinical Social Work was referred by  [facility]  for assessment of psychosocial needs.   SDOH (Social Determinants of Health) assessments performed: Yes SDOH Interventions    Flowsheet Row Office Visit from 02/19/2022 in [place] at [facility]  SDOH Interventions   Food Insecurity Interventions Intervention Not Indicated  Housing Interventions Intervention Not Indicated  Transportation Interventions Intervention Not Indicated  Utilities Interventions Intervention Not Indicated  Alcohol Usage Interventions Intervention Not Indicated (Score <7)  Physical Activity Interventions Intervention Not Indicated  Stress Interventions Intervention Not Indicated       SDOH Screenings   Food Insecurity: No Food Insecurity (02/19/2022)  Housing: Low Risk  (02/19/2022)  Transportation Needs: No Transportation Needs (02/19/2022)  Utilities: Not At Risk (02/19/2022)  Alcohol Screen: Low Risk  (02/19/2022)  Physical Activity: Inactive (02/19/2022)  Stress: Stress Concern Present (02/19/2022)  Tobacco Use: Low Risk  (02/19/2022)     Distress Screen completed: Yes    02/19/2022   11:42 AM  ONCBCN DISTRESS SCREENING  Screening Type Initial Screening  Distress experienced in past week (1-10) 7  Practical problem type Insurance;Work/school  Emotional problem type Nervousness/Anxiety;Isolation/feeling alone  Information Concerns Type Lack of info about diagnosis;Lack of info about treatment  Physical Problem type Sleep/insomnia      Family/Social Information:  Housing Arrangement: patient lives alone Family members/support persons in your life? Family (2 adult children locally, sister), Friends, and Education officer, community concerns: no  Employment: Working full time as Licensed conveyancer.  Income source: Employment Financial concerns: No Type of  concern: None Food access concerns: no Religious or spiritual practice: Yes-relies on her faith to help cope Services Currently in place:  therapist; additional cancer plan- working on paperwork  Coping/ Adjustment to diagnosis: Patient understands treatment plan and what happens next? yes, feels relieved after meeting with medical team and hearing optimistic outlook. Feels she has a better idea now of how it will impact work and what she will need to do to prepare Patient reported stressors: Anxiety/ nervousness and Adjusting to my illness Patient enjoys reading, time with family/ friends, and puzzles Current coping skills/ strengths: Active sense of humor , Communication skills , Motivation for treatment/growth , Religious Affiliation , Special hobby/interest , and Supportive family/friends     SUMMARY: Current SDOH Barriers:  No major SDOH barriers noted today  Clinical Social Work Clinical Goal(s):  Increase social support by connecting with a peer mentor  Interventions: Discussed common feeling and emotions when being diagnosed with cancer, and the importance of support during treatment Informed patient of the support team roles and support services at [facility] Provided CSW contact information and encouraged patient to call with any questions or concerns Referred patient to [place] guide/ peer mentor   Follow Up Plan: Patient will contact CSW with any support or resource needs. Patient will continue to follow-up with her counselor (appt today) Patient verbalizes understanding of plan: Yes    Hitoshi Werts E Dalvin Clipper, LCSW

## 2022-02-19 NOTE — Progress Notes (Signed)
[name]  Patient Care Team: Kathyrn Lass, MD as PCP - General (Family Medicine) Lorelle Gibbs, MD (Radiology) Yisroel Ramming, Everardo All, MD as Consulting Physician (Obstetrics and Gynecology) Mauro Kaufmann, RN as Oncology Nurse Navigator Rockwell Germany, RN as Oncology Nurse Navigator Benay Pike, MD as Consulting Physician (Hematology and Oncology) Kyung Rudd, MD as Consulting Physician (Radiation Oncology) Donnie Mesa, MD as Consulting Physician (General Surgery)  CHIEF COMPLAINTS/PURPOSE OF CONSULTATION:  Newly diagnosed breast cancer  HISTORY OF PRESENTING ILLNESS:  Chelsea [name] 57 y.o. female is here because of recent diagnosis of left breast cancer  I reviewed her records extensively and collaborated the history with the patient.  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  Ultrasound of the breast confirmed 5 x 5 x 4 mm irregular mass in right breast at 3:00 middle depth suspicious for malignancy.  Biopsy recommended.  6 x 3 x 5 mm irregular mass in the right breast at 1:00 anterior depth suspicious of malignancy, biopsy recommended.  8 x 6 x 3 mm irregular mass in the left breast at 1:00 middle depth suspicious of malignancy.  Biopsy recommended   02/10/2022 Pathology Results   Left breast needle core biopsy at 1:00 5 cm from the nipple shows invasive mammary carcinoma, overall grade 2.  Right breast needle core biopsy at 3:00 3 cm from the nipple shows CSL.  Right breast needle core biopsy 1:00 2 cm from nipple shows benign breast parenchyma with PASH, no malignancy identified.  Prognostics from the left breast biopsy showed ER 80% positive strong staining PR 50% positive strong staining Ki-67 of  40% and HER2 negative by IHC.   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 Chelsea [name] is here for an initial visit with her sister Ms. Chelsea [name].  She is a Radio producer by occupation.  She has 2 kids who are 73 and 70.  She used birth control with Mirena mostly for menstrual cycle regulation.  She had her last menstrual cycle in December.  She did breast-feed her children and denies any use of HRT.  No family history of any cancer.  Rest of the pertinent 10 point ROS reviewed and negative  MEDICAL HISTORY:  Past Medical History:  Diagnosis Date   Allergy    Anemia    Depression 2004   History - situational, no meds   Encounter for insertion of mirena IUD 08/04/2008   Enlarged thyroid    being monitored yearly   History of COVID-19 02/2019   Hx of adenomatous polyp of colon 01/23/2016   Hypertriglyceridemia    Multiple thyroid nodules    being monitored yearly   Seasonal allergies    Seasonal allergies    SVD (spontaneous vaginal delivery)    x 2    SURGICAL HISTORY: Past Surgical History:  Procedure Laterality Date   APPENDECTOMY  1988  COLONOSCOPY  04/22/2019   COLONOSCOPY  01/04/2016   COLPOSCOPY  1994   ASCUS   DILATION AND CURETTAGE OF UTERUS N/A 06/13/2014   Procedure: DILATATION AND CURETTAGE;  Surgeon: Nunzio Cobbs, MD;  Location: [facility];  Service: Gynecology;  Laterality: N/A;   HYSTEROSCOPY N/A 06/13/2014   Procedure: HYSTEROSCOPY WITH NOVASURE ABLATION and removal of malpositioned IUD ;  Surgeon: Nunzio Cobbs, MD;  Location: [facility];  Service: Gynecology;   Laterality: N/A;   LAPAROSCOPIC TUBAL LIGATION N/A 06/13/2014   Procedure: LAPAROSCOPIC BILATERAL TUBAL LIGATION WITH CAUTERY ;  Surgeon: Nunzio Cobbs, MD;  Location: [facility];  Service: Gynecology;  Laterality: N/A;   thyroid biopsy  10/11   hyplerplastic thyroid nodule- multinodular goiter   TUBAL LIGATION  06-13-14   WISDOM TOOTH EXTRACTION      SOCIAL HISTORY: Social History   Socioeconomic History   Marital status: Divorced    Spouse name: Not on file   Number of children: Not on file   Years of education: Not on file   Highest education level: Not on file  Occupational History   Not on file  Tobacco Use   Smoking status: Never   Smokeless tobacco: Never  Vaping Use   Vaping Use: Never used  Substance and Sexual Activity   Alcohol use: Yes    Alcohol/week: 2.0 - 3.0 standard drinks of alcohol    Types: 2 - 3 Glasses of wine per week   Drug use: No   Sexual activity: Yes    Partners: Male    Birth control/protection: Surgical    Comment: BTL  Other Topics Concern   Not on file  Social History Narrative   Not on file   Social Determinants of Health   Financial Resource Strain: Not on file  Food Insecurity: No Food Insecurity (02/19/2022)   Hunger Vital Sign    Worried About Running Out of Food in the Last Year: Never true    Ran Out of Food in the Last Year: Never true  Transportation Needs: No Transportation Needs (02/19/2022)   PRAPARE - Hydrologist (Medical): No    Lack of Transportation (Non-Medical): No  Physical Activity: Inactive (02/19/2022)   Exercise Vital Sign    Days of Exercise per Week: 0 days    Minutes of Exercise per Session: 0 min  Stress: Stress Concern Present (02/19/2022)   [place]    Feeling of Stress : Rather much  Social Connections: Not on file  Intimate Partner Violence: Not on file    FAMILY HISTORY: Family History  Problem  Relation Age of Onset   Hypertension Mother    Hypertension Father    Colon polyps Father    Colon cancer Neg Hx    Rectal cancer Neg Hx    Esophageal cancer Neg Hx    Stomach cancer Neg Hx     ALLERGIES:  is allergic to elemental sulfur, codeine, mupirocin, percocet [oxycodone-acetaminophen], sulfa antibiotics, and sulfamethoxazole-trimethoprim.  MEDICATIONS:  Current Outpatient Medications  Medication Sig Dispense Refill   Ibuprofen 200 MG CAPS Take by mouth.     methocarbamol (ROBAXIN) 500 MG tablet Take 1 tablet by mouth as needed. c     naproxen (NAPROSYN) 500 MG tablet Take 500 mg by mouth 2 (two) times daily as needed. Reported on 02/26/2015  2   No current facility-administered medications for this visit.  REVIEW OF SYSTEMS:   Constitutional: Denies fevers, chills or abnormal night sweats Eyes: Denies blurriness of vision, double vision or watery eyes Ears, nose, mouth, throat, and face: Denies mucositis or sore throat Respiratory: Denies cough, dyspnea or wheezes Cardiovascular: Denies palpitation, chest discomfort or lower extremity swelling Gastrointestinal:  Denies nausea, heartburn or change in bowel habits Skin: Denies abnormal skin rashes Lymphatics: Denies new lymphadenopathy or easy bruising Neurological:Denies numbness, tingling or new weaknesses Behavioral/Psych: Mood is stable, no new changes  Breast: Denies any palpable lumps or discharge All other systems were reviewed with the patient and are negative.  PHYSICAL EXAMINATION: ECOG PERFORMANCE STATUS: 0 - Asymptomatic  Vitals:   02/19/22 0846  BP: (!) 149/77  Pulse: 66  Resp: 16  Temp: 98.3 F (36.8 C)  SpO2: 98%   Filed Weights   02/19/22 0846  Weight: 173 lb 12.8 oz (78.8 kg)    GENERAL:alert, no distress and comfortable NECK: supple, thyroid normal size, non-tender, without nodularity LYMPH:  no palpable lymphadenopathy in the cervical, axillary or inguinal LUNGS: clear to auscultation  and percussion with normal breathing effort HEART: regular rate & rhythm and no murmurs and no lower extremity edema PSYCH: alert & oriented x 3 with fluent speech NEURO: no focal motor/sensory deficits BREAST: Bilateral breasts inspected and palpated.  No palpable masses.  No regional adenopathy  LABORATORY DATA:  I have reviewed the data as listed Lab Results  Component Value Date   WBC 5.1 02/19/2022   HGB 14.4 02/19/2022   HCT 41.5 02/19/2022   MCV 94.5 02/19/2022   PLT 311 02/19/2022   Lab Results  Component Value Date   NA 140 02/19/2022   K 4.1 02/19/2022   CL 104 02/19/2022   CO2 29 02/19/2022    RADIOGRAPHIC STUDIES: I have personally reviewed the radiological reports and agreed with the findings in the report.  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  Biopsy from the 2 right-sided breast masses suggestive of CSL and patch with no evidence of malignancy.  Given tumor measuring more than 5 mm, we have discussed about Oncotype testing.  I have discussed the following details about Oncotype.  We have discussed about Oncotype Dx score which is a well validated prognostic scoring system which can predict outcome with endocrine therapy alone and whether chemotherapy reduces recurrence.  Typically in patients with ER positive cancers that are node negative if the RS score is high typically greater than or equal to 26, chemotherapy is recommended.  In women with intermediate recurrence score younger than 26, there can still be some role for chemotherapy in addition to endocrine therapy especially if the recurrence score is between 21-25. If chemotherapy is needed, this will precede radiation and then after  radiation she will continue on antiestrogen therapy.  We have discussed options for antiestrogen therapy today.  Since she is premenopausal, I focused my discussion on tamoxifen.  With regards to Tamoxifen, we discussed that this is a SERM, selective estrogen receptor modulator. We discussed mechanism of action of Tamoxifen, adverse effects on Tamoxifen including but not limited to post menopausal symptoms, increased risk of DVT/PE, increased risk of endometrial cancer, questionable cataracts with long term use and increased risk of cardiovascular events in the study which was  not statistically significant. A benefit from Tamoxifen would be improvement in bone density.  She is agreeable to the plan.  She will return to clinic after surgery to review final pathology and Oncotype results.  All her questions were answered to the best my knowledge.   Total time spent: 60 minutes including history, physical exam, review of records, counseling and coordination of care All questions were answered. The patient knows to call the clinic with any problems, questions or concerns.    Benay Pike, MD 02/19/22

## 2022-02-19 NOTE — H&P (View-Only) (Signed)
Breast MDC 02/15/2022 [place] [place]   Subjective    Chief Complaint: Breast Cancer       History of Present Illness: Chelsea [name] is a 57 y.o. female who is seen today as an office consultation at the request of Dr. Chryl Heck for evaluation of Breast Cancer .     Annual screening mammogram on 01/24/2022 revealed an indeterminate mass in the right lower inner quadrant.  The left upper outer quadrant shows some architectural distortion.  Diagnostic mammogram on 02/04/2022 revealed a 0.6 cm oval mass in the right breast at 3:00 middle depth.  There is also a 0.6 cm oval mass in the right breast at 1:00 anterior depth.  The left breast revealed 1.2 cm area of architectural distortion.   Ultrasound revealed a 0.5 x 0.5 x 0.4 cm irregular mass in the right breast at 3:00 middle depth.  In the same breast at 1:00, there is a 0.6 x 0.3 x 0.5 cm mass.  The left breast revealed a 0.8 x 0.6 x 0.3 cm irregular mass at 1:00 middle depth.   She underwent biopsies of this area.  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  The right breast mass at 3:00 revealed a complex sclerosing lesion.  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       Review of Systems: A complete review of systems was obtained from the patient.  I have reviewed this information and discussed as appropriate with the patient.  See HPI as well for other ROS.   Review of Systems  Constitutional: Negative.   HENT: Negative.    Eyes: Negative.   Respiratory: Negative.    Cardiovascular: Negative.   Gastrointestinal: Negative.   Genitourinary: Negative.   Musculoskeletal: Negative.   Skin: Negative.   Neurological: Negative.   Endo/Heme/Allergies: Negative.   Psychiatric/Behavioral: Negative.          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   Codeine Other (See Comments)   Mupirocin Hives   Oxycodone-Acetaminophen Itching, Other (See Comments) and Nausea              Current Outpatient Medications on File Prior to Visit  Medication Sig Dispense Refill   methocarbamoL (ROBAXIN) 500 MG tablet Take 500 mg by mouth 4 (four) times daily        No current facility-administered medications on file prior to visit.      Family History       Family History  Problem Relation  Age of Onset   High blood pressure (Hypertension) Mother     High blood pressure (Hypertension) Father     Hyperlipidemia (Elevated cholesterol) Father          Social History       Tobacco Use  Smoking Status Never  Smokeless Tobacco Never      Social History  Social History         Socioeconomic History   Marital status: Divorced  Tobacco Use   Smoking status: Never   Smokeless tobacco: Never  Vaping Use   Vaping Use: Never used  Substance and Sexual Activity   Alcohol use: Yes      Comment: 4-5 x weekly per patient   Drug use: Never        Objective:        Physical Exam    Constitutional:  WDWN in NAD, conversant, no obvious deformities; lying in bed comfortably Eyes:  Pupils equal, round; sclera anicteric; moist conjunctiva; no lid lag HENT:  Oral mucosa moist; good dentition  Neck:  No masses palpated, trachea midline; no  thyromegaly Lungs:  CTA bilaterally; normal respiratory effort Breasts:  symmetric, no nipple changes; slight ecchymosis at the biopsy sites bilaterally; no palpable masses or lymphadenopathy on either side CV:  Regular rate and rhythm; no murmurs; extremities well-perfused with no edema Abd:  +bowel sounds, soft, non-tender, no palpable organomegaly; no palpable hernias Musc:  Normal gait; no apparent clubbing or cyanosis in extremities Lymphatic:  No palpable cervical or axillary lymphadenopathy Skin:  Warm, dry; no sign of jaundice Psychiatric - alert and oriented x 4; calm mood and affect     Labs, Imaging and Diagnostic Testing: Diagnosis 1. Breast, left, needle core biopsy, 1 o'clock, 5 cmfn - INVASIVE MAMMARY CARCINOMA, SEE NOTE - TUBULE FORMATION: SCORE 3 - NUCLEAR PLEOMORPHISM: SCORE 2 - MITOTIC COUNT: SCORE 1 - TOTAL SCORE: 6 - OVERALL GRADE: 2 - LYMPHOVASCULAR INVASION: NOT IDENTIFIED - CANCER LENGTH: 0.3 CM - CALCIFICATIONS: NOT IDENTIFIED NOTE: DR. Vic Ripper REVIEWED THE CASE AND CONCURS WITH THE INTERPRETATION. A BREAST PROGNOSTIC PROFILE (ER, PR, KI-67 AND HER2) IS PENDING AND WILL BE REPORTED IN AN ADDENDUM. SOLIS WAS NOTIFIED ON 02/11/2022. 2. Breast, right, needle core biopsy, 3 o'clock, 3 cmfn - COMPLEX SCLEROSING LESION - NO MALIGNANCY IDENTIFIED 3. Breast, right, needle core biopsy, 1 o'clock, 2 cmfn - BENIGN BREAST PARENCHYMA WITH PSEUDOANGIOMATOUS STROMAL HYPERPLASIA, AND ADENOSIS - NO MALIGNANCY IDENTIFIED Nupur Sharma M.D. Pathologist, Electronic Signature   ADDITIONAL INFORMATION: 1. Breast, left, needle core biopsy, 1 o'clock, 5 cmfn PROGNOSTIC INDICATORS Results: IMMUNOHISTOCHEMICAL AND MORPHOMETRIC ANALYSIS PERFORMED MANUALLY The tumor cells are negative for Her2 (0). Estrogen Receptor: 80%, POSITIVE, STRONG STAINING INTENSITY Progesterone Receptor: 50%, POSITIVE, STRONG STAINING INTENSITY Proliferation Marker Ki67: 40% REFERENCE RANGE ESTROGEN  RECEPTOR NEGATIVE 0% POSITIVE =>1% REFERENCE RANGE PROGESTERONE RECEPTOR NEGATIVE 0% POSITIVE =>1% All controls stained appropriately Claudette Laws MD Pathologist, Electronic Signature ( Signed 02/12/2022)     Assessment and Plan:  Diagnoses and all orders for this visit:   Malignant neoplasm of lower-outer quadrant of left breast of female, estrogen receptor positive    Complex sclerosing lesion of right breast     I spent a long time with the patient and her companion discussing treatment options.  We discussed the complex sclerosing lesion of the right breast and recommended simple lumpectomy.  We discussed surgical options for her cancer.  We discussed mastectomy versus breast conserving therapy.  She is a good candidate for  breast conserving therapy with a left breast radioactive seed localized lumpectomy and sentinel lymph node biopsy.  This will likely be followed by radiation and antiestrogens.  Oncology is planning to perform Oncotype testing on the surgical specimen.   Recommend left breast radioactive seed localized lumpectomy with sentinel lymph node biopsy, as well as right breast radioactive seed localized lumpectomy.  The surgical procedure has been discussed with the patient.  Potential risks, benefits, alternative treatments, and expected outcomes have been explained.  All of the patient's questions at this time have been answered.  The likelihood of reaching the patient's treatment goal is good.  The patient understand the proposed surgical procedure and wishes to proceed.         Ethelda Deangelo Jearld Adjutant, MD  02/19/2022 10:56 AM

## 2022-02-19 NOTE — Therapy (Signed)
OUTPATIENT PHYSICAL THERAPY BREAST CANCER BASELINE EVALUATION   Patient Name: MARGENE CHERIAN MRN: [id number] DOB:Jan 10, 1966, 57 y.o., female Today's Date: 02/19/2022  END OF SESSION:  PT End of Session - 02/19/22 1035     Visit Number 1    Number of Visits 2    Date for PT Re-Evaluation 04/16/22    PT Start Time 1006    PT Stop Time 1030    PT Time Calculation (min) 24 min    Activity Tolerance Patient tolerated treatment well    Behavior During Therapy [facility] for tasks assessed/performed             Past Medical History:  Diagnosis Date   Allergy    Anemia    Depression 2004   History - situational, no meds   Encounter for insertion of mirena IUD 08/04/2008   Enlarged thyroid    being monitored yearly   History of COVID-19 02/2019   Hx of adenomatous polyp of colon 01/23/2016   Hypertriglyceridemia    Multiple thyroid nodules    being monitored yearly   Seasonal allergies    Seasonal allergies    SVD (spontaneous vaginal delivery)    x 2   Past Surgical History:  Procedure Laterality Date   APPENDECTOMY  1988   COLONOSCOPY  04/22/2019   COLONOSCOPY  01/04/2016   COLPOSCOPY  1994   ASCUS   DILATION AND CURETTAGE OF UTERUS N/A 06/13/2014   Procedure: DILATATION AND CURETTAGE;  Surgeon: Nunzio Cobbs, MD;  Location: [facility];  Service: Gynecology;  Laterality: N/A;   HYSTEROSCOPY N/A 06/13/2014   Procedure: HYSTEROSCOPY WITH NOVASURE ABLATION and removal of malpositioned IUD ;  Surgeon: Nunzio Cobbs, MD;  Location: [facility];  Service: Gynecology;  Laterality: N/A;   LAPAROSCOPIC TUBAL LIGATION N/A 06/13/2014   Procedure: LAPAROSCOPIC BILATERAL TUBAL LIGATION WITH CAUTERY ;  Surgeon: Nunzio Cobbs, MD;  Location: [facility];  Service: Gynecology;  Laterality: N/A;   thyroid biopsy  10/11   hyplerplastic thyroid nodule- multinodular goiter   TUBAL LIGATION  06-13-14   WISDOM TOOTH EXTRACTION     Patient Active Problem List   Diagnosis Date  Noted   Primary malignant neoplasm of upper inner quadrant of female breast, left ([place]) 02/17/2022   Hx of adenomatous polyp of colon 01/23/2016    REFERRING PROVIDER: Dr. Donnie Mesa  REFERRING DIAG: Left breast cancer  THERAPY DIAG:  Malignant neoplasm of upper-outer quadrant of left breast in female, estrogen receptor positive ([place])  Abnormal posture  Rationale for Evaluation and Treatment: Rehabilitation  ONSET DATE: 01/24/2022  SUBJECTIVE:  SUBJECTIVE STATEMENT: Patient reports she is here today to be seen by her medical team for her newly diagnosed left breast cancer.   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 It measures 0.8 cm and is located in the upper outer quadrant. It is ER/PR positive and HER2 negative with a Ki67 of 40%. She has a complex sclerosing lesion on the right side.   PATIENT GOALS:   reduce lymphedema risk and learn post op HEP.   PAIN:  Are you having pain? No  PRECAUTIONS: Active CA   HAND DOMINANCE: right  WEIGHT BEARING RESTRICTIONS: No  FALLS:  Has patient fallen in last 6 months? No  LIVING ENVIRONMENT: Patient lives with: her 85 y.o. 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 [employer]: She does not exercise  PRIOR LEVEL OF FUNCTION: Independent   OBJECTIVE:  COGNITION: Overall cognitive status: Within functional limits for tasks assessed    POSTURE:  Forward head and rounded shoulders posture  UPPER EXTREMITY AROM/PROM:  A/PROM RIGHT   eval   Shoulder extension 45  Shoulder flexion 158  Shoulder abduction 172  Shoulder internal rotation 52  Shoulder external rotation 90     (Blank rows = not tested)  A/PROM LEFT   eval  Shoulder extension 45  Shoulder flexion 163  Shoulder abduction 174  Shoulder internal rotation 63  Shoulder external rotation 90    (Blank rows = not tested)  CERVICAL AROM: All within normal limits  UPPER EXTREMITY STRENGTH: WNL  LYMPHEDEMA ASSESSMENTS:   LANDMARK RIGHT   eval  10 cm proximal to olecranon process 27.1  Olecranon process 23.5  10 cm proximal to ulnar styloid process 21.3  Just proximal to ulnar styloid process 15.9  Across hand at thumb web space 18.3  At base of 2nd digit 6.3  (Blank rows = not tested)  LANDMARK LEFT   eval  10 cm proximal to olecranon process 26.7  Olecranon process 22.8  10 cm proximal to ulnar styloid process 20  Just proximal to ulnar styloid process 15.3  Across hand at thumb web space 18.7  At base of 2nd digit 6.2  (Blank rows = not tested)  L-DEX LYMPHEDEMA SCREENING:  The patient was assessed using the L-Dex machine today to produce a lymphedema index baseline score. The patient will be reassessed on a regular basis (typically every 3 months) to obtain new L-Dex scores. If the score is > 6.5 points away from his/her baseline score indicating onset of subclinical lymphedema, it will be recommended to wear a compression garment for 4 weeks, 12 hours per day and then be reassessed. If the score continues to be > 6.5 points from baseline at reassessment, we will initiate lymphedema treatment. Assessing in this manner has a 95% rate of preventing clinically significant lymphedema.   L-DEX FLOWSHEETS - 02/19/22 1000       L-DEX LYMPHEDEMA SCREENING   Measurement Type Unilateral    L-DEX MEASUREMENT EXTREMITY Upper Extremity    POSITION  Standing    DOMINANT SIDE Right    At Risk Side Left    BASELINE SCORE (UNILATERAL) 0.7             QUICK DASH SURVEY:  Katina Dung - 02/19/22 0001     Open a tight or new jar No difficulty    Do heavy household chores (wash walls,  wash floors) No difficulty    Carry a shopping bag or briefcase No difficulty    Wash your back No difficulty    Use a knife to cut food No difficulty    Recreational activities in which you take some force or impact through your arm, shoulder, or hand (golf, hammering, tennis) Mild difficulty    During the past week, to what extent has your arm, shoulder or hand problem interfered with your normal social activities with family, friends, neighbors, or groups? Not at all    During the past week, to what extent has your arm, shoulder or hand problem limited your work or other regular daily activities Not at all    Arm, shoulder, or hand pain. None    Tingling (pins and needles) in your arm, shoulder, or hand Mild    Difficulty Sleeping No difficulty    DASH Score 4.55 %              PATIENT EDUCATION:  Education details: Lymphedema risk reduction and post op shoulder/posture HEP Person educated: Patient Education method: Explanation, Demonstration, Handout Education comprehension: Patient verbalized understanding and returned demonstration  HOME EXERCISE PROGRAM: Patient was instructed today in a home exercise program today for post op shoulder range of motion. These included active assist shoulder flexion in sitting, scapular retraction, wall walking with shoulder abduction, and hands behind head external rotation.  She was encouraged to do these twice a day, holding 3 seconds and repeating 5 times when permitted by her physician.   ASSESSMENT:  CLINICAL IMPRESSION: Patient was diagnosed on 01/24/2022 with 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 It measures 0.8 cm and is located in the upper outer quadrant. It is ER/PR positive and HER2 negative with a Ki67 of 40%. She has a complex sclerosing lesion on the right side.  Her multidisciplinary medical team met prior to her assessments to determine a recommended treatment plan. She is planning to have a bilateral  lumpectomy and left sentinel node biopsy followed by Oncotype testing, radiation, and anti-estrogen therapy. She will benefit from a post op PT reassessment to determine needs and from L-Dex screens every 3 months for 2 years to detect subclinical lymphedema.  Pt will benefit from skilled therapeutic intervention to improve on the following deficits: Decreased knowledge of precautions, impaired UE functional use, pain, decreased ROM, postural dysfunction.   PT treatment/interventions: ADL/self-care home management, pt/family education, therapeutic exercise  REHAB POTENTIAL: Excellent  CLINICAL DECISION MAKING: Stable/uncomplicated  EVALUATION COMPLEXITY: Low   GOALS: Goals reviewed with patient? YES  LONG TERM GOALS: (STG=LTG)    Name Target Date Goal status  1 Pt will be able to verbalize understanding of pertinent lymphedema risk reduction practices relevant to her dx specifically related to skin care.  Baseline:  No knowledge 02/19/2022 Achieved at eval  2 Pt will be able to return demo and/or verbalize understanding of the post op HEP related to regaining shoulder ROM. Baseline:  No knowledge 02/19/2022 Achieved at eval  3 Pt will be able to verbalize understanding of the importance of attending the post op After Breast CA Class for further lymphedema risk reduction education and therapeutic exercise.  Baseline:  No knowledge 02/19/2022 Achieved at eval  4 Pt will demo she has regained full shoulder ROM and function post operatively compared to baselines.  Baseline: See objective measurements taken today. 04/16/2022     PLAN:  PT FREQUENCY/DURATION: EVAL and 1 follow up appointment.   PLAN FOR NEXT SESSION: will  reassess 3-4 weeks post op to determine needs.   Patient will follow up at outpatient cancer rehab 3-4 weeks following surgery.  If the patient requires physical therapy at that time, a specific plan will be dictated and sent to the referring physician for approval. The  patient was educated today on appropriate basic range of motion exercises to begin post operatively and the importance of attending the After Breast Cancer class following surgery.  Patient was educated today on lymphedema risk reduction practices as it pertains to recommendations that will benefit the patient immediately following surgery.  She verbalized good understanding.    Physical Therapy Information for After Breast Cancer Surgery/Treatment:  Lymphedema is a swelling condition that you may be at risk for in your arm if you have lymph nodes removed from the armpit area.  After a sentinel node biopsy, the risk is approximately 5-9% and is higher after an axillary node dissection.  There is treatment available for this condition and it is not life-threatening.  Contact your physician or physical therapist with concerns. You may begin the 4 shoulder/posture exercises (see additional sheet) when permitted by your physician (typically a week after surgery).  If you have drains, you may need to wait until those are removed before beginning range of motion exercises.  A general recommendation is to not lift your arms above shoulder height until drains are removed.  These exercises should be done to your tolerance and gently.  This is not a "no pain/no gain" type of recovery so listen to your body and stretch into the range of motion that you can tolerate, stopping if you have pain.  If you are having immediate reconstruction, ask your plastic surgeon about doing exercises as he or she may want you to wait. We encourage you to attend the free one time ABC (After Breast Cancer) class offered by [facility].  You will learn information related to lymphedema risk, prevention and treatment and additional exercises to regain mobility following surgery.  You can call [phone] for more information.  This is offered the 1st and 3rd Monday of each month.  You only attend the class one  time. While undergoing any medical procedure or treatment, try to avoid blood pressure being taken or needle sticks from occurring on the arm on the side of cancer.   This recommendation begins after surgery and continues for the rest of your life.  This may help reduce your risk of getting lymphedema (swelling in your arm). An excellent resource for those seeking information on lymphedema is the National Lymphedema Network's web site. It can be accessed at [place].org If you notice swelling in your hand, arm or breast at any time following surgery (even if it is many years from now), please contact your doctor or physical therapist to discuss this.  Lymphedema can be treated at any time but it is easier for you if it is treated early on.  If you feel like your shoulder motion is not returning to normal in a reasonable amount of time, please contact your surgeon or physical therapist.  [facility] [phone]. [address], [address], [place] [address]  ABC CLASS After Breast Cancer Class  After Breast Cancer Class is a specially designed exercise class to assist you in a safe recover after having breast cancer surgery.  In this class you will learn how to get back to full function whether your drains were just removed or if you  had surgery a month ago.  This one-time class is held the 1st and 3rd Monday of every month from 11:00 a.m. until 12:00 noon virtually.  This class is FREE and space is limited. For more information or to register for the next available class, call [phone].  Class Goals  Understand specific stretches to improve the flexibility of you chest and shoulder. Learn ways to safely strengthen your upper body and improve your posture. Understand the warning signs of infection and why you may be at risk for an arm infection. Learn about Lymphedema and prevention.  ** You do not attend this class until after surgery.  Drains must  be removed to participate  Patient was instructed today in a home exercise program today for post op shoulder range of motion. These included active assist shoulder flexion in sitting, scapular retraction, wall walking with shoulder abduction, and hands behind head external rotation.  She was encouraged to do these twice a day, holding 3 seconds and repeating 5 times when permitted by her physician.  Annia Friendly, [place] 02/19/22 10:55 AM

## 2022-02-19 NOTE — Research (Signed)
[employer] 2021-05 - Specimen Collection Study to Evaluate Biomarkers in Subjects with Cancer    Patient Chelsea [name] was identified by Dr. Chryl Heck as a potential candidate for the above listed study.  This Clinical Research Nurse met with Chelsea [name], [id number], on 02/19/22 in a manner and location that ensures patient privacy to discuss participation in the above listed research study.  Patient is Accompanied by her sister .  A copy of the informed consent document with embedded HIPAA language was provided to the patient.  Patient reads, speaks, and understands [place].   Patient was provided with the business card of this Nurse and encouraged to contact the research team with any questions.  Approximately 15 minutes was spent with the patient reviewing the informed consent documents.  Patient was provided the option of taking informed consent documents home to review and was encouraged to review at their convenience with their support network, including other care providers. Patient took the consent documents home to review.  The pt agreed that the research team can call her on Monday, 02/24/22 in the afternoon to discuss the study and her study participation.  The pt was told that her study participation is optional.  The pt was explained the difference between genetic testing and this research specimen study.  The pt was informed that she and her MD will not get any results back on her health status.  The pt was informed that there is no direct benefit to her for enrolling into the study.   The pt was explained the purpose of this specimen study.  The pt was thanked for her interest in the study. Brion Aliment RN, BSN, CCRP Clinical Research Nurse Lead 02/19/2022 12:26 PM

## 2022-02-19 NOTE — Assessment & Plan Note (Addendum)
This is a very pleasant 57 year old female patient with newly diagnosed left breast invasive ductal carcinoma, overall grade 2, ER/PR positive, HER2 negative, Ki-67 of 40% referred to breast [place] for recommendations.  Biopsy from the 2 right-sided breast masses suggestive of CSL and patch with no evidence of malignancy.  Given tumor measuring more than 5 mm, we have discussed about Oncotype testing.  I have discussed the following details about Oncotype.  We have discussed about Oncotype Dx score which is a well validated prognostic scoring system which can predict outcome with endocrine therapy alone and whether chemotherapy reduces recurrence.  Typically in patients with ER positive cancers that are node negative if the RS score is high typically greater than or equal to 26, chemotherapy is recommended.  In women with intermediate recurrence score younger than 35, there can still be some role for chemotherapy in addition to endocrine therapy especially if the recurrence score is between 21-25. If chemotherapy is needed, this will precede radiation and then after radiation she will continue on antiestrogen therapy.  We have discussed options for antiestrogen therapy today.  Since she is premenopausal, I focused my discussion on tamoxifen.  With regards to Tamoxifen, we discussed that this is a SERM, selective estrogen receptor modulator. We discussed mechanism of action of Tamoxifen, adverse effects on Tamoxifen including but not limited to post menopausal symptoms, increased risk of DVT/PE, increased risk of endometrial cancer, questionable cataracts with long term use and increased risk of cardiovascular events in the study which was not statistically significant. A benefit from Tamoxifen would be improvement in bone density.  She is agreeable to the plan.  She will return to clinic after surgery to review final pathology and Oncotype results.  All her questions were answered to the best my  knowledge.

## 2022-02-19 NOTE — Progress Notes (Unsigned)
REFERRING PROVIDER: Benay Pike, MD  PRIMARY PROVIDER:  Kathyrn Lass, MD  PRIMARY REASON FOR VISIT:  1. Primary malignant neoplasm of upper inner quadrant of female breast, left ([place])    HISTORY OF PRESENT ILLNESS:   Chelsea [name], a 57 y.o. female, was seen for a [place] cancer genetics consultation at the request of Dr. Chryl Heck due to a personal history of cancer.  Chelsea [name] presents to clinic today to discuss the possibility of a hereditary predisposition to cancer, to discuss genetic testing, and to further clarify her future cancer risks, as well as potential cancer risks for family members.   In January 2024, at the age of 65, Chelsea [name] was diagnosed with {CA PATHOLOGY:63853} of the {right left (wildcard):15202} {CA JQBHA:19379}.    CANCER HISTORY:  Oncology History  Primary malignant neoplasm of upper inner quadrant of female breast, left ([place])  01/24/2022 Mammogram   Mammogram from December 29 showed an oval mass in the right breast lower inner aspect posterior depth which is indeterminate.  Ultrasound of the breast confirmed 5 x 5 x 4 mm irregular mass in right breast at 3:00 middle depth suspicious for malignancy.  Biopsy recommended.  6 x 3 x 5 mm irregular mass in the right breast at 1:00 anterior depth suspicious of malignancy, biopsy recommended.  8 x 6 x 3 mm irregular mass in the left breast at 1:00 middle depth suspicious of malignancy.  Biopsy recommended   02/10/2022 Pathology Results   Left breast needle core biopsy at 1:00 5 cm from the nipple shows invasive mammary carcinoma, overall grade 2.  Right breast needle core biopsy at 3:00 3 cm from the nipple shows CSL.  Right breast needle core biopsy 1:00 2 cm from nipple shows benign breast parenchyma with PASH, no malignancy identified.  Prognostics from the left breast biopsy showed ER 80% positive strong staining PR 50% positive strong staining Ki-67 of 40% and HER2 negative by IHC.   02/17/2022 Initial Diagnosis   Primary  malignant neoplasm of upper inner quadrant of female breast, left ([place])   02/17/2022 Cancer Staging   Staging form: Breast, AJCC 8th Edition - Clinical stage from 02/17/2022: Stage IA (cT1b, cN0, cM0, G2, ER+, PR+, HER2-) - Signed by Hayden Pedro, PA-C on 02/17/2022 Stage prefix: Initial diagnosis Method of lymph node assessment: Clinical Histologic grading system: 3 grade system      RISK FACTORS:  Menarche was at age ***.  First live birth at age ***.  OCP use for approximately {Numbers 1-12 multi-select:20307} years.  Ovaries intact: {Yes/No-Ex:120004}.  Uterus intact: {Yes/No-Ex:120004}.  Menopausal status: {Menopause:31378}.  HRT use: {Numbers 1-12 multi-select:20307} years. Colonoscopy: {Yes/No-Ex:120004}; {normal/abnormal/not examined:14677}. Mammogram within the last year: {Yes/No-Ex:120004}. Number of breast biopsies: {Numbers 1-12 multi-select:20307}. Up to date with pelvic exams: {Yes/No-Ex:120004}. Any excessive radiation exposure in the past: {Yes/No-Ex:120004}  Past Medical History:  Diagnosis Date   Allergy    Anemia    Depression 2004   History - situational, no meds   Encounter for insertion of mirena IUD 08/04/2008   Enlarged thyroid    being monitored yearly   History of COVID-19 02/2019   Hx of adenomatous polyp of colon 01/23/2016   Hypertriglyceridemia    Multiple thyroid nodules    being monitored yearly   Seasonal allergies    Seasonal allergies    SVD (spontaneous vaginal delivery)    x 2    Past Surgical History:  Procedure Laterality Date   APPENDECTOMY  1988  COLONOSCOPY  04/22/2019   COLONOSCOPY  01/04/2016   COLPOSCOPY  1994   ASCUS   DILATION AND CURETTAGE OF UTERUS N/A 06/13/2014   Procedure: DILATATION AND CURETTAGE;  Surgeon: Nunzio Cobbs, MD;  Location: [facility];  Service: Gynecology;  Laterality: N/A;   HYSTEROSCOPY N/A 06/13/2014   Procedure: HYSTEROSCOPY WITH NOVASURE ABLATION and removal of malpositioned  IUD ;  Surgeon: Nunzio Cobbs, MD;  Location: [facility];  Service: Gynecology;  Laterality: N/A;   LAPAROSCOPIC TUBAL LIGATION N/A 06/13/2014   Procedure: LAPAROSCOPIC BILATERAL TUBAL LIGATION WITH CAUTERY ;  Surgeon: Nunzio Cobbs, MD;  Location: [facility];  Service: Gynecology;  Laterality: N/A;   thyroid biopsy  10/11   hyplerplastic thyroid nodule- multinodular goiter   TUBAL LIGATION  06-13-14   WISDOM TOOTH EXTRACTION      Social History   Socioeconomic History   Marital status: Divorced    Spouse name: Not on file   Number of children: Not on file   Years of education: Not on file   Highest education level: Not on file  Occupational History   Not on file  Tobacco Use   Smoking status: Never   Smokeless tobacco: Never  Vaping Use   Vaping Use: Never used  Substance and Sexual Activity   Alcohol use: Yes    Alcohol/week: 2.0 - 3.0 standard drinks of alcohol    Types: 2 - 3 Glasses of wine per week   Drug use: No   Sexual activity: Yes    Partners: Male    Birth control/protection: Surgical    Comment: BTL  Other Topics Concern   Not on file  Social History Narrative   Not on file   Social Determinants of Health   Financial Resource Strain: Not on file  Food Insecurity: No Food Insecurity (02/19/2022)   Hunger Vital Sign    Worried About Running Out of Food in the Last Year: Never true    Ran Out of Food in the Last Year: Never true  Transportation Needs: No Transportation Needs (02/19/2022)   PRAPARE - Hydrologist (Medical): No    Lack of Transportation (Non-Medical): No  Physical Activity: Inactive (02/19/2022)   Exercise Vital Sign    Days of Exercise per Week: 0 days    Minutes of Exercise per Session: 0 min  Stress: Stress Concern Present (02/19/2022)   [place]    Feeling of Stress : Rather much  Social Connections: Not on file     FAMILY  HISTORY:  We obtained a detailed, 4-generation family history.  Significant diagnoses are listed below: Family History  Problem Relation Age of Onset   Hypertension Mother    Hypertension Father    Colon polyps Father    Colon cancer Neg Hx    Rectal cancer Neg Hx    Esophageal cancer Neg Hx    Stomach cancer Neg Hx    Chelsea [name] reports no family history of cancer but she does not have any information about her maternal family medical history. Chelsea [name] is unaware of previous family history of genetic testing for hereditary cancer risks. There is no reported Ashkenazi Jewish ancestry.  GENETIC COUNSELING ASSESSMENT: Chelsea [name] is a 57 y.o. female with a personal history of cancer which is somewhat suggestive of a hereditary predisposition to cancer given her age at diagnosis (. We, therefore, discussed and recommended  the following at today's visit.   DISCUSSION: We discussed that 5 - 10% of cancer is hereditary, with most cases of breast cancer associated with BRCA1/2.  There are other genes that can be associated with hereditary breast cancer syndromes.  We discussed that testing is beneficial for several reasons including knowing how to follow individuals after completing their treatment, identifying whether potential treatment options would be beneficial, and understanding if other family members could be at risk for cancer and allowing them to undergo genetic testing.   We reviewed the characteristics, features and inheritance patterns of hereditary cancer syndromes. We also discussed genetic testing, including the appropriate family members to test, the process of testing, insurance coverage and turn-around-time for results. We discussed the implications of a negative, positive, carrier and/or variant of uncertain significant result. We recommended Chelsea [name] pursue genetic testing for a panel that includes genes associated with breast cancer.   Based on Chelsea [name]'s personal history of cancer, she  does not meet NCCN criteria for genetic testing. However, we are currently recommending genetic testing to all women diagnosed with breast cancer under age 56. She may have an out of pocket cost. We discussed that if her out of pocket cost for testing is over $100, the laboratory will call and confirm whether she wants to proceed with testing.  If the out of pocket cost of testing is less than $100 she will be billed by the genetic testing laboratory.   PLAN: Despite our recommendation, Chelsea [name] did not wish to pursue genetic testing at today's visit. We understand this decision and remain available to coordinate genetic testing at any time in the future. We, therefore, recommend Chelsea [name] continue to follow the cancer screening guidelines given by her primary healthcare provider.  Chelsea [name] questions were answered to her satisfaction today. Our contact information was provided should additional questions or concerns arise. Thank you for the referral and allowing [place] to share in the care of your patient.   Lucille Passy, MS, [facility] Genetic Counselor [email or web address] (P) [phone]  The patient was seen for a total of 20 minutes in face-to-face genetic counseling.  The patient brought her friend. Drs. Lindi Adie and/or Burr Medico were available to discuss this case as needed.   _______________________________________________________________________ For Office Staff:  Number of people involved in session: 2 Was an Intern/ student involved with case: no

## 2022-02-26 ENCOUNTER — Other Ambulatory Visit: Payer: Self-pay | Admitting: *Deleted

## 2022-02-26 ENCOUNTER — Telehealth: Payer: Self-pay | Admitting: *Deleted

## 2022-02-26 DIAGNOSIS — C50212 Malignant neoplasm of upper-inner quadrant of left female breast: Secondary | ICD-10-CM

## 2022-02-26 NOTE — Telephone Encounter (Signed)
[employer] 2021-05 - Specimen Collection Study to Evaluate Biomarkers in Subjects with Cancer    This patient was identified by Dr. Chryl Heck as a potential candidate for the above listed study.  This Clinical Research Nurse called this Chelsea [name] this afternoon to discuss participation in the above listed research study.  The Chelsea [name] confirmed that she is still interested in participating in this research specimen study.  The Chelsea [name] agreed to meet with the research nurse on Tuesday, 03/04/22 at 8 am to review and sign the consent form and have her blood samples drawn for study purposes.  The Chelsea [name] had no questions or concerns about the study.  The Chelsea [name] is aware that once she has signed the consent form and given an acceptable blood specimen then she will receive a $50 gift card.   The Chelsea [name] was thanked for her interest in this study. This research nurse will have a 2nd nurse confirm this Chelsea [name]'s eligibility on 03/04/22 prior to enrolling subject into this study.     Brion Aliment RN, BSN, CCRP Clinical Research Nurse Lead 02/26/2022 4:20 PM

## 2022-02-27 ENCOUNTER — Encounter: Payer: Self-pay | Admitting: *Deleted

## 2022-02-27 ENCOUNTER — Encounter (HOSPITAL_BASED_OUTPATIENT_CLINIC_OR_DEPARTMENT_OTHER): Payer: Self-pay | Admitting: Surgery

## 2022-02-27 ENCOUNTER — Telehealth: Payer: Self-pay | Admitting: *Deleted

## 2022-02-27 ENCOUNTER — Other Ambulatory Visit: Payer: Self-pay

## 2022-02-27 NOTE — Telephone Encounter (Signed)
Left message for a return phone call to follow up from [facility] 1/24 and assess navigation needs.

## 2022-03-02 NOTE — Anesthesia Preprocedure Evaluation (Signed)
Anesthesia Evaluation  Patient identified by MRN, date of birth, ID band Patient awake    Reviewed: Allergy & Precautions, NPO status , Patient's Chart, lab work & pertinent test results  History of Anesthesia Complications Negative for: history of anesthetic complications  Airway Mallampati: II  TM Distance: >3 FB Neck ROM: Full    Dental no notable dental hx.    Pulmonary neg pulmonary ROS   Pulmonary exam normal        Cardiovascular negative cardio ROS Normal cardiovascular exam     Neuro/Psych    Depression    negative neurological ROS     GI/Hepatic negative GI ROS, Neg liver ROS,,,  Endo/Other  negative endocrine ROS    Renal/GU negative Renal ROS  negative genitourinary   Musculoskeletal negative musculoskeletal ROS (+)    Abdominal   Peds  Hematology negative hematology ROS (+)   Anesthesia Other Findings LEFT BREAST INVASIVE DUCTAL CARCINOMA, RIGHT BREAST COMPLEX SCLEROSING LESION  Reproductive/Obstetrics negative OB ROS                              Anesthesia Physical Anesthesia Plan  ASA: 2  Anesthesia Plan: General   Post-op Pain Management: Tylenol PO (pre-op)*, Toradol IV (intra-op)* and Regional block*   Induction: Intravenous  PONV Risk Score and Plan: 3 and Treatment may vary due to age or medical condition, Ondansetron, Dexamethasone, Midazolam and Scopolamine patch - Pre-op  Airway Management Planned: LMA  Additional Equipment: None  Intra-op Plan:   Post-operative Plan: Extubation in OR  Informed Consent: I have reviewed the patients History and Physical, chart, labs and discussed the procedure including the risks, benefits and alternatives for the proposed anesthesia with the patient or authorized representative who has indicated his/her understanding and acceptance.     Dental advisory given  Plan Discussed with: CRNA  Anesthesia Plan Comments:          Anesthesia Quick Evaluation

## 2022-03-04 ENCOUNTER — Inpatient Hospital Stay: Payer: BC Managed Care – PPO | Attending: Nurse Practitioner | Admitting: *Deleted

## 2022-03-04 ENCOUNTER — Inpatient Hospital Stay: Payer: BC Managed Care – PPO

## 2022-03-04 ENCOUNTER — Encounter: Payer: Self-pay | Admitting: *Deleted

## 2022-03-04 ENCOUNTER — Other Ambulatory Visit: Payer: Self-pay

## 2022-03-04 DIAGNOSIS — C50212 Malignant neoplasm of upper-inner quadrant of left female breast: Secondary | ICD-10-CM

## 2022-03-04 DIAGNOSIS — Z170 Estrogen receptor positive status [ER+]: Secondary | ICD-10-CM | POA: Insufficient documentation

## 2022-03-04 DIAGNOSIS — C50412 Malignant neoplasm of upper-outer quadrant of left female breast: Secondary | ICD-10-CM | POA: Insufficient documentation

## 2022-03-04 LAB — RESEARCH LABS

## 2022-03-04 NOTE — Research (Signed)
[employer] 2021-05 - Specimen Collection Study to Evaluate Biomarkers in Subjects with Cancer    This Nurse has reviewed this patient's inclusion and exclusion criteria as a second review and confirms Chelsea [name] is eligible for study participation.  Patient may continue with enrollment.  Marjie Skiff Dan Scearce, RN, BSN, [facility]  [facility] Clinical Research Nurse [place] [phone]  Pager [phone] 03/04/2022 8:32 AM

## 2022-03-04 NOTE — Research (Signed)
[employer] 2021-05 - Specimen Collection Study to Evaluate Biomarkers in Subjects with Cancer    Patient Chelsea [name] was identified by Chelsea [name] as a potential candidate for the above listed study.  This Clinical Research Nurse met with Chelsea [name], Chelsea [name] on 03/04/22 in a manner and location that ensures patient privacy to discuss participation in the above listed research study.  Patient is Unaccompanied.  Patient was previously provided with informed consent documents.  Patient confirmed they have read the informed consent documents.  As outlined in the informed consent form, this Nurse and Chelsea [name] the purpose of the research study, the investigational nature of the study, study procedures and requirements for study participation, potential risks and benefits of study participation, as well as alternatives to participation.  This study is not blinded or double-blinded. The patient understands participation is voluntary and they may withdraw from study participation at any time.  This study does not involve randomization.  This study does not involve an investigational drug or device. This study does not involve a placebo. Patient understands enrollment is pending full eligibility review.   Confidentiality and how the patient's information will be used as part of study participation were [name].  Patient was informed there is reimbursement provided for their time and effort spent on trial participation.  The patient is encouraged to discuss research study participation with their insurance provider to determine what costs they may incur as part of study participation, including research related injury.    All questions were answered to patient's satisfaction.  The informed consent with embedded HIPAA language was reviewed page by page.  The patient's mental and emotional status is appropriate to provide informed consent, and the patient verbalizes an understanding of  study participation.  Patient [name] agreed to participate in the above listed research study and [name] voluntarily signed the informed consent [employer] Approved version [id number] (revised [phone]) with embedded HIPAA language, version [employer] approved version [id number] (revised [id number])  on 03/04/22 at 8:06 AM.  The patient was provided with a copy of the signed informed consent form with embedded HIPAA language for their reference.  No study specific procedures were obtained prior to the signing of the informed consent document.  Approximately 15 minutes were spent with the patient reviewing the informed consent documents.  Patient was not requested to complete a Release of Information form.   Eligibility:This Nurse [name] reviewed this patient's inclusion and exclusion criteria with the patient and confirmed Chelsea [name] is eligible for study participation.  The pt states she [name] not started any treatment for her current cancer.  She is scheduled for her breast surgery tomorrow.  She also denies any recent biopsies within the past week, and she denies any scans with contrast within the past 24 hours. Chelsea [name], 2nd nurse reviewer, confirmed that pt is eligible for study entry.  Eligibility confirmed by treating investigator, Chelsea [name], who also agrees that patient should proceed with enrollment. Patient will continue with enrollment.  Menopausal status: Chelsea [name] is premenopausal with LMP (08/27/21).    Medical History:   The pt provided the following responses after she signed consent.   High Blood Pressure  No Coronary Artery Disease No Lupus    No Rheumatoid Arthritis  No  Diabetes   No      Lynch Syndrome  No  Is the patient currently taking a magnesium supplement?   No  Does the patient have a personal history of  cancer (greater than 5 years ago)?  No  Does the patient have a family history of cancer in 1st or 2nd degree relatives? No  Does the patient have history of  alcohol consumption? Yes   If yes, current or former? Current  Number of years? 35  Drinks per week? 4  Does the patient have history of cigarette, cigar, pipe, or chewing tobacco use?  No  Blood Collection:  Research blood obtained by venipuncture.  Patient tolerated the blood draw well without any adverse event.    Gift Card:  $50 gift card given to the pt by Chelsea [name], research assistant.    The pt was thanked for her participation in this data and specimen study.  The research nurse will request her tumor block to submit to the study and enter her required study data.  Chelsea Aliment RN, BSN, CCRP Clinical Research Nurse Lead 03/04/2022 9:02 AM

## 2022-03-04 NOTE — Progress Notes (Signed)

## 2022-03-05 ENCOUNTER — Ambulatory Visit (HOSPITAL_BASED_OUTPATIENT_CLINIC_OR_DEPARTMENT_OTHER): Payer: BC Managed Care – PPO | Admitting: Anesthesiology

## 2022-03-05 ENCOUNTER — Other Ambulatory Visit: Payer: Self-pay

## 2022-03-05 ENCOUNTER — Encounter (HOSPITAL_BASED_OUTPATIENT_CLINIC_OR_DEPARTMENT_OTHER)
Admission: RE | Disposition: A | Discharge status: Home / Self Care | Payer: Self-pay | Source: Ambulatory Visit | Attending: Surgery

## 2022-03-05 ENCOUNTER — Ambulatory Visit (HOSPITAL_BASED_OUTPATIENT_CLINIC_OR_DEPARTMENT_OTHER)
Admission: RE | Admit: 2022-03-05 | Discharge: 2022-03-05 | Disposition: A | Discharge status: Home / Self Care | Payer: BC Managed Care – PPO | Source: Ambulatory Visit | Attending: Surgery | Admitting: Surgery

## 2022-03-05 ENCOUNTER — Encounter (HOSPITAL_COMMUNITY)
Admission: RE | Admit: 2022-03-05 | Discharge: 2022-03-05 | Disposition: A | Discharge status: Home / Self Care | Payer: BC Managed Care – PPO | Source: Ambulatory Visit | Attending: Surgery | Admitting: Surgery

## 2022-03-05 ENCOUNTER — Encounter (HOSPITAL_BASED_OUTPATIENT_CLINIC_OR_DEPARTMENT_OTHER): Payer: Self-pay | Admitting: Surgery

## 2022-03-05 DIAGNOSIS — N6022 Fibroadenosis of left breast: Secondary | ICD-10-CM | POA: Insufficient documentation

## 2022-03-05 DIAGNOSIS — Z170 Estrogen receptor positive status [ER+]: Secondary | ICD-10-CM | POA: Diagnosis not present

## 2022-03-05 DIAGNOSIS — N6489 Other specified disorders of breast: Secondary | ICD-10-CM | POA: Diagnosis not present

## 2022-03-05 DIAGNOSIS — C50412 Malignant neoplasm of upper-outer quadrant of left female breast: Secondary | ICD-10-CM | POA: Diagnosis present

## 2022-03-05 DIAGNOSIS — Z01818 Encounter for other preprocedural examination: Secondary | ICD-10-CM

## 2022-03-05 DIAGNOSIS — C50912 Malignant neoplasm of unspecified site of left female breast: Secondary | ICD-10-CM

## 2022-03-05 HISTORY — PX: BREAST LUMPECTOMY WITH RADIOACTIVE SEED LOCALIZATION: SHX6424

## 2022-03-05 HISTORY — PX: BREAST LUMPECTOMY WITH RADIOACTIVE SEED AND SENTINEL LYMPH NODE BIOPSY: SHX6550

## 2022-03-05 LAB — POCT PREGNANCY, URINE: Preg Test, Ur: NEGATIVE

## 2022-03-05 SURGERY — BREAST LUMPECTOMY WITH RADIOACTIVE SEED AND SENTINEL LYMPH NODE BIOPSY
Anesthesia: General | Site: Breast | Laterality: Right

## 2022-03-05 MED ORDER — ONDANSETRON HCL 4 MG/2ML IJ SOLN: INTRAMUSCULAR | Status: DC | PRN

## 2022-03-05 MED ORDER — CHLORHEXIDINE GLUCONATE CLOTH 2 % EX PADS: 6.0000 | MEDICATED_PAD | Freq: Once | CUTANEOUS | Status: DC

## 2022-03-05 MED ORDER — ACETAMINOPHEN 500 MG PO TABS
1000.0000 mg | ORAL_TABLET | ORAL | Status: AC
  Administered 2022-03-05: 1000 mg via ORAL

## 2022-03-05 MED ORDER — CEFAZOLIN SODIUM-DEXTROSE 2-4 GM/100ML-% IV SOLN
2.0000 g | INTRAVENOUS | Status: AC
  Administered 2022-03-05: 2 g via INTRAVENOUS

## 2022-03-05 MED ORDER — HYDROMORPHONE HCL 1 MG/ML IJ SOLN: 0.2500 mg | INTRAMUSCULAR | Status: DC | PRN

## 2022-03-05 MED ORDER — KETOROLAC TROMETHAMINE 30 MG/ML IJ SOLN
INTRAMUSCULAR | Status: AC
  Filled 2022-03-05: qty 1

## 2022-03-05 MED ORDER — BUPIVACAINE LIPOSOME 1.3 % IJ SUSP
INTRAMUSCULAR | Status: DC | PRN
  Administered 2022-03-05: 10 mL via PERINEURAL

## 2022-03-05 MED ORDER — SCOPOLAMINE 1 MG/3DAYS TD PT72
MEDICATED_PATCH | TRANSDERMAL | Status: AC
  Filled 2022-03-05: qty 1

## 2022-03-05 MED ORDER — DEXAMETHASONE SODIUM PHOSPHATE 10 MG/ML IJ SOLN
INTRAMUSCULAR | Status: AC
  Filled 2022-03-05: qty 1

## 2022-03-05 MED ORDER — CEFAZOLIN SODIUM-DEXTROSE 2-4 GM/100ML-% IV SOLN
INTRAVENOUS | Status: AC
  Filled 2022-03-05: qty 100

## 2022-03-05 MED ORDER — TECHNETIUM TC 99M TILMANOCEPT KIT
1.0000 | PACK | Freq: Once | INTRAVENOUS | Status: AC | PRN
  Administered 2022-03-05: 1 via INTRADERMAL

## 2022-03-05 MED ORDER — ONDANSETRON HCL 4 MG/2ML IJ SOLN
INTRAMUSCULAR | Status: DC | PRN
  Administered 2022-03-05: 4 mg via INTRAVENOUS

## 2022-03-05 MED ORDER — LIDOCAINE HCL (CARDIAC) PF 100 MG/5ML IV SOSY
PREFILLED_SYRINGE | INTRAVENOUS | Status: DC | PRN
  Administered 2022-03-05: 100 mg via INTRAVENOUS

## 2022-03-05 MED ORDER — PROPOFOL 10 MG/ML IV BOLUS
INTRAVENOUS | Status: AC
  Filled 2022-03-05: qty 20

## 2022-03-05 MED ORDER — BUPIVACAINE-EPINEPHRINE (PF) 0.5% -1:200000 IJ SOLN
INTRAMUSCULAR | Status: DC | PRN
  Administered 2022-03-05: 20 mL via PERINEURAL

## 2022-03-05 MED ORDER — EPHEDRINE 5 MG/ML INJ
INTRAVENOUS | Status: AC
  Filled 2022-03-05: qty 5

## 2022-03-05 MED ORDER — MAGTRACE LYMPHATIC TRACER
INTRAMUSCULAR | Status: DC | PRN
  Administered 2022-03-05: 2 mL via INTRAMUSCULAR

## 2022-03-05 MED ORDER — LACTATED RINGERS IV SOLN: INTRAVENOUS | Status: DC

## 2022-03-05 MED ORDER — MIDAZOLAM HCL 2 MG/2ML IJ SOLN
INTRAMUSCULAR | Status: AC
  Filled 2022-03-05: qty 2

## 2022-03-05 MED ORDER — BUPIVACAINE-EPINEPHRINE (PF) 0.5% -1:200000 IJ SOLN
INTRAMUSCULAR | Status: AC
  Filled 2022-03-05: qty 30

## 2022-03-05 MED ORDER — AMISULPRIDE (ANTIEMETIC) 5 MG/2ML IV SOLN: 10.0000 mg | Freq: Once | INTRAVENOUS | Status: DC | PRN

## 2022-03-05 MED ORDER — TRAMADOL HCL 50 MG PO TABS: 50.0000 mg | ORAL_TABLET | Freq: Four times a day (QID) | ORAL | 0 refills | Status: DC | PRN

## 2022-03-05 MED ORDER — FENTANYL CITRATE (PF) 100 MCG/2ML IJ SOLN
INTRAMUSCULAR | Status: AC
  Filled 2022-03-05: qty 2

## 2022-03-05 MED ORDER — ONDANSETRON HCL 4 MG/2ML IJ SOLN
INTRAMUSCULAR | Status: AC
  Filled 2022-03-05: qty 2

## 2022-03-05 MED ORDER — LIDOCAINE 2% (20 MG/ML) 5 ML SYRINGE
INTRAMUSCULAR | Status: AC
  Filled 2022-03-05: qty 5

## 2022-03-05 MED ORDER — ACETAMINOPHEN 500 MG PO TABS
ORAL_TABLET | ORAL | Status: AC
  Filled 2022-03-05: qty 2

## 2022-03-05 MED ORDER — EPHEDRINE SULFATE (PRESSORS) 50 MG/ML IJ SOLN
INTRAMUSCULAR | Status: DC | PRN
  Administered 2022-03-05: 10 mg via INTRAVENOUS

## 2022-03-05 MED ORDER — FENTANYL CITRATE (PF) 100 MCG/2ML IJ SOLN
50.0000 ug | Freq: Once | INTRAMUSCULAR | Status: AC
  Administered 2022-03-05: 50 ug via INTRAVENOUS

## 2022-03-05 MED ORDER — MIDAZOLAM HCL 2 MG/2ML IJ SOLN
1.0000 mg | Freq: Once | INTRAMUSCULAR | Status: AC
  Administered 2022-03-05: 1 mg via INTRAVENOUS

## 2022-03-05 MED ORDER — KETOROLAC TROMETHAMINE 30 MG/ML IJ SOLN
INTRAMUSCULAR | Status: DC | PRN
  Administered 2022-03-05: 30 mg via INTRAVENOUS

## 2022-03-05 MED ORDER — PROPOFOL 10 MG/ML IV BOLUS
INTRAVENOUS | Status: DC | PRN
  Administered 2022-03-05: 180 mg via INTRAVENOUS

## 2022-03-05 MED ORDER — BUPIVACAINE HCL (PF) 0.25 % IJ SOLN
INTRAMUSCULAR | Status: DC | PRN
  Administered 2022-03-05: 30 mL

## 2022-03-05 MED ORDER — ACETAMINOPHEN 500 MG PO TABS: 1000.0000 mg | ORAL_TABLET | Freq: Once | ORAL | Status: AC

## 2022-03-05 MED ORDER — FENTANYL CITRATE (PF) 100 MCG/2ML IJ SOLN
INTRAMUSCULAR | Status: DC | PRN
  Administered 2022-03-05: 100 ug via INTRAVENOUS

## 2022-03-05 MED ORDER — DEXAMETHASONE SODIUM PHOSPHATE 4 MG/ML IJ SOLN
INTRAMUSCULAR | Status: DC | PRN
  Administered 2022-03-05: 8 mg via INTRAVENOUS

## 2022-03-05 MED ORDER — SCOPOLAMINE 1 MG/3DAYS TD PT72
1.0000 | MEDICATED_PATCH | Freq: Once | TRANSDERMAL | Status: DC
  Administered 2022-03-05: 1.5 mg via TRANSDERMAL

## 2022-03-05 SURGICAL SUPPLY — 59 items
APL PRP STRL LF DISP 70% ISPRP (MISCELLANEOUS) ×4
APL SKNCLS STERI-STRIP NONHPOA (GAUZE/BANDAGES/DRESSINGS) ×4
APPLIER CLIP 9.375 MED OPEN (MISCELLANEOUS) ×2
APR CLP MED 9.3 20 MLT OPN (MISCELLANEOUS) ×2
BENZOIN TINCTURE PRP APPL 2/3 (GAUZE/BANDAGES/DRESSINGS) ×3 IMPLANT
BINDER BREAST 3XL (GAUZE/BANDAGES/DRESSINGS) IMPLANT
BINDER BREAST LRG (GAUZE/BANDAGES/DRESSINGS) IMPLANT
BINDER BREAST MEDIUM (GAUZE/BANDAGES/DRESSINGS) IMPLANT
BINDER BREAST XLRG (GAUZE/BANDAGES/DRESSINGS) IMPLANT
BINDER BREAST XXLRG (GAUZE/BANDAGES/DRESSINGS) IMPLANT
BLADE CLIPPER SURG (BLADE) IMPLANT
BLADE HEX COATED 2.75 (ELECTRODE) ×3 IMPLANT
BLADE SURG 10 STRL SS (BLADE) IMPLANT
BLADE SURG 15 STRL LF DISP TIS (BLADE) ×3 IMPLANT
BLADE SURG 15 STRL SS (BLADE) ×2
CANISTER SUC SOCK COL 7IN (MISCELLANEOUS) ×3 IMPLANT
CANISTER SUCT 1200ML W/VALVE (MISCELLANEOUS) IMPLANT
CHLORAPREP W/TINT 26 (MISCELLANEOUS) ×3 IMPLANT
CLIP APPLIE 9.375 MED OPEN (MISCELLANEOUS) ×3 IMPLANT
COVER BACK TABLE 60X90IN (DRAPES) ×3 IMPLANT
COVER MAYO STAND STRL (DRAPES) ×3 IMPLANT
COVER PROBE CYLINDRICAL 5X96 (MISCELLANEOUS) ×3 IMPLANT
DRAPE LAPAROSCOPIC ABDOMINAL (DRAPES) ×3 IMPLANT
DRAPE LAPAROTOMY 100X72 PEDS (DRAPES) ×3 IMPLANT
DRAPE UTILITY XL STRL (DRAPES) ×3 IMPLANT
DRSG TEGADERM 4X4.75 (GAUZE/BANDAGES/DRESSINGS) ×6 IMPLANT
ELECT REM PT RETURN 9FT ADLT (ELECTROSURGICAL) ×2
ELECTRODE REM PT RTRN 9FT ADLT (ELECTROSURGICAL) ×3 IMPLANT
GAUZE SPONGE 4X4 12PLY STRL LF (GAUZE/BANDAGES/DRESSINGS) IMPLANT
GLOVE BIO SURGEON STRL SZ7 (GLOVE) ×3 IMPLANT
GLOVE BIOGEL PI IND STRL 7.5 (GLOVE) ×3 IMPLANT
GOWN STRL REUS W/ TWL LRG LVL3 (GOWN DISPOSABLE) ×6 IMPLANT
GOWN STRL REUS W/TWL LRG LVL3 (GOWN DISPOSABLE) ×4
ILLUMINATOR WAVEGUIDE N/F (MISCELLANEOUS) IMPLANT
KIT MARKER MARGIN INK (KITS) ×3 IMPLANT
LIGHT WAVEGUIDE WIDE FLAT (MISCELLANEOUS) IMPLANT
NDL HYPO 25X1 1.5 SAFETY (NEEDLE) ×6 IMPLANT
NDL SAFETY ECLIP 18X1.5 (MISCELLANEOUS) ×3 IMPLANT
NEEDLE HYPO 25X1 1.5 SAFETY (NEEDLE) ×4 IMPLANT
NS IRRIG 1000ML POUR BTL (IV SOLUTION) ×3 IMPLANT
PACK BASIN DAY SURGERY FS (CUSTOM PROCEDURE TRAY) ×3 IMPLANT
PENCIL SMOKE EVACUATOR (MISCELLANEOUS) ×3 IMPLANT
SLEEVE SCD COMPRESS KNEE MED (STOCKING) ×3 IMPLANT
SPIKE FLUID TRANSFER (MISCELLANEOUS) IMPLANT
SPONGE GAUZE 2X2 8PLY STRL LF (GAUZE/BANDAGES/DRESSINGS) IMPLANT
SPONGE T-LAP 18X18 ~~LOC~~+RFID (SPONGE) IMPLANT
SPONGE T-LAP 4X18 ~~LOC~~+RFID (SPONGE) ×3 IMPLANT
STRIP CLOSURE SKIN 1/2X4 (GAUZE/BANDAGES/DRESSINGS) ×3 IMPLANT
SUT MON AB 4-0 PC3 18 (SUTURE) ×3 IMPLANT
SUT SILK 2 0 SH (SUTURE) IMPLANT
SUT VIC AB 3-0 SH 27 (SUTURE) ×6
SUT VIC AB 3-0 SH 27X BRD (SUTURE) ×3 IMPLANT
SYR BULB EAR ULCER 3OZ GRN STR (SYRINGE) ×3 IMPLANT
SYR CONTROL 10ML LL (SYRINGE) ×6 IMPLANT
TOWEL GREEN STERILE FF (TOWEL DISPOSABLE) ×3 IMPLANT
TRACER MAGTRACE VIAL (MISCELLANEOUS) IMPLANT
TRAY FAXITRON CT DISP (TRAY / TRAY PROCEDURE) ×3 IMPLANT
TUBE CONNECTING 20X1/4 (TUBING) ×3 IMPLANT
YANKAUER SUCT BULB TIP NO VENT (SUCTIONS) IMPLANT

## 2022-03-05 NOTE — Anesthesia Procedure Notes (Signed)
Procedure Name: LMA Insertion Date/Time: 03/05/2022 8:33 AM  Performed by: Bufford Spikes, CRNAPre-anesthesia Checklist: Patient identified, Emergency Drugs available, Suction available and Patient being monitored Patient Re-evaluated:Patient Re-evaluated prior to induction Oxygen Delivery Method: Circle system utilized Preoxygenation: Pre-oxygenation with 100% oxygen Induction Type: IV induction Ventilation: Mask ventilation without difficulty LMA: LMA inserted LMA Size: 4.0 Number of attempts: 1 Placement Confirmation: positive ETCO2 Tube secured with: Tape Dental Injury: Teeth and Oropharynx as per pre-operative assessment

## 2022-03-05 NOTE — Progress Notes (Signed)
Assisted Dr. Daiva Huge with left, pectoralis, ultrasound guided block. Side rails up, monitors on throughout procedure. See vital signs in flow sheet. Tolerated Procedure well.  Timeout performed for Nuc Med injections [id number]

## 2022-03-05 NOTE — Anesthesia Procedure Notes (Addendum)
Anesthesia Regional Block: Popliteal block   Pre-Anesthetic Checklist: , timeout performed,  Correct Patient, Correct Site, Correct Laterality,  Correct Procedure, Correct Position, site marked,  Risks and benefits discussed,  Pre-op evaluation,  At surgeon's request and post-op pain management  Laterality: Left  Prep: Maximum Sterile Barrier Precautions used, chloraprep       Needles:  Injection technique: Single-shot  Needle Type: Echogenic Stimulator Needle     Needle Length: 9cm  Needle Gauge: 22     Additional Needles:   Procedures:,,,, ultrasound used (permanent image in chart),,    Narrative:  Start time: 03/05/2022 8:06 AM End time: 03/05/2022 8:09 AM Injection made incrementally with aspirations every 5 mL.  Performed by: Personally  Anesthesiologist: Brennan Bailey, MD  Additional Notes: Risks, benefits, and alternative discussed. Patient gave consent for procedure. Patient prepped and draped in sterile fashion. Sedation administered, patient remains easily responsive to voice. Relevant anatomy identified with ultrasound guidance. Local anesthetic given in 5cc increments with no signs or symptoms of intravascular injection. No pain or paraesthesias with injection. Patient monitored throughout procedure with signs of LAST or immediate complications. Tolerated well. Ultrasound image placed in chart.  Tawny Asal, MD

## 2022-03-05 NOTE — Discharge Instructions (Addendum)
[facility] Office Phone Number [phone]  BREAST BIOPSY/ PARTIAL MASTECTOMY: POST OP INSTRUCTIONS  Always review your discharge instruction sheet given to you by the facility where your surgery was performed.  IF YOU HAVE DISABILITY OR FAMILY LEAVE FORMS, YOU MUST BRING THEM TO THE OFFICE FOR PROCESSING.  DO NOT GIVE THEM TO YOUR DOCTOR.  A prescription for pain medication may be given to you upon discharge.  Take your pain medication as prescribed, if needed.  If narcotic pain medicine is not needed, then you may take acetaminophen (Tylenol) or ibuprofen (Advil) as needed. Take your usually prescribed medications unless otherwise directed If you need a refill on your pain medication, please contact your pharmacy.  They will contact our office to request authorization.  Prescriptions will not be filled after 5pm or on week-ends. You should eat very light the first 24 hours after surgery, such as soup, crackers, pudding, etc.  Resume your normal diet the day after surgery. Most patients will experience some swelling and bruising in the breast.  Ice packs and a good support bra will help.  Swelling and bruising can take several days to resolve.  It is common to experience some constipation if taking pain medication after surgery.  Increasing fluid intake and taking a stool softener will usually help or prevent this problem from occurring.  A mild laxative (Milk of Magnesia or Miralax) should be taken according to package directions if there are no bowel movements after 48 hours. Unless discharge instructions indicate otherwise, you may remove your bandages 24-48 hours after surgery, and you may shower at that time.  You may have steri-strips (small skin tapes) in place directly over the incision.  These strips should be left on the skin for 7-10 days.  If your surgeon used skin glue on the incision, you may shower in 24 hours.  The glue will flake off over the next 2-3 weeks.  Any  sutures or staples will be removed at the office during your follow-up visit. ACTIVITIES:  You may resume regular daily activities (gradually increasing) beginning the next day.  Wearing a good support bra or sports bra minimizes pain and swelling.  You may have sexual intercourse when it is comfortable. You may drive when you no longer are taking prescription pain medication, you can comfortably wear a seatbelt, and you can safely maneuver your car and apply brakes. RETURN TO WORK:  ______________________________________________________________________________________ You should see your doctor in the office for a follow-up appointment approximately two weeks after your surgery.  Your doctor's nurse will typically make your follow-up appointment when she calls you with your pathology report.  Expect your pathology report 2-3 business days after your surgery.  You may call to check if you do not hear from us after three days. OTHER INSTRUCTIONS: _______________________________________________________________________________________________ _____________________________________________________________________________________________________________________________________ _____________________________________________________________________________________________________________________________________ _____________________________________________________________________________________________________________________________________  WHEN TO CALL YOUR DOCTOR: Fever over 101.0 Nausea and/or vomiting. Extreme swelling or bruising. Continued bleeding from incision. Increased pain, redness, or drainage from the incision.  The clinic staff is available to answer your questions during regular business hours.  Please don't hesitate to call and ask to speak to one of the nurses for clinical concerns.  If you have a medical emergency, go to the nearest emergency room or call 911.  A surgeon from [facility] is always on call at the hospital.  For further questions, please visit [email or web address]    Post Anesthesia Home Care Instructions  Activity: Get plenty of rest for the remainder of   the day. A responsible individual must stay with you for 24 hours following the procedure.  For the next 24 hours, DO NOT: -Drive a car -Paediatric nurse -Drink alcoholic beverages -Take any medication unless instructed by your physician -Make any legal decisions or sign important papers.  Meals: Start with liquid foods such as gelatin or soup. Progress to regular foods as tolerated. Avoid greasy, spicy, heavy foods. If nausea and/or vomiting occur, drink only clear liquids until the nausea and/or vomiting subsides. Call your physician if vomiting continues.  Special Instructions/Symptoms: Your throat may feel dry or sore from the anesthesia or the breathing tube placed in your throat during surgery. If this causes discomfort, gargle with warm salt water. The discomfort should disappear within 24 hours.  If you had a scopolamine patch placed behind your ear for the management of post- operative nausea and/or vomiting:  1. The medication in the patch is effective for 72 hours, after which it should be removed.  Wrap patch in a tissue and discard in the trash. Wash hands thoroughly with soap and water. 2. You may remove the patch earlier than 72 hours if you experience unpleasant side effects which may include dry mouth, dizziness or visual disturbances. 3. Avoid touching the patch. Wash your hands with soap and water after contact with the patch.    *May have Tylenol today at 1pm 03/05/2022 *May have Ibuprofen today at 4pm 03/05/2022   Information for Discharge Teaching: EXPAREL (bupivacaine liposome injectable suspension)   Your surgeon or anesthesiologist gave you EXPAREL(bupivacaine) to help control your pain after surgery.  EXPAREL is a local anesthetic that provides pain  relief by numbing the tissue around the surgical site. EXPAREL is designed to release pain medication over time and can control pain for up to 72 hours. Depending on how you respond to EXPAREL, you may require less pain medication during your recovery.  Possible side effects: Temporary loss of sensation or ability to move in the area where bupivacaine was injected. Nausea, vomiting, constipation Rarely, numbness and tingling in your mouth or lips, lightheadedness, or anxiety may occur. Call your doctor right away if you think you may be experiencing any of these sensations, or if you have other questions regarding possible side effects.  Follow all other discharge instructions given to you by your surgeon or nurse. Eat a healthy diet and drink plenty of water or other fluids.  If you return to the hospital for any reason within 96 hours following the administration of EXPAREL, it is important for health care providers to know that you have received this anesthetic. A teal colored band has been placed on your arm with the date, time and amount of EXPAREL you have received in order to alert and inform your health care providers. Please leave this armband in place for the full 96 hours following administration, and then you may remove the band.

## 2022-03-05 NOTE — Interval H&P Note (Signed)
History and Physical Interval Note:  03/05/2022 8:18 AM  Chelsea [name]  has presented today for surgery, with the diagnosis of LEFT BREAST INVASIVE DUCTAL CARCINOMA, RIGHT BREAST COMPLEX SCLEROSING LESION.  The various methods of treatment have been discussed with the patient and family. After consideration of risks, benefits and other options for treatment, the patient has consented to  Procedure(s) with comments: LEFT BREAST LUMPECTOMY WITH RADIOACTIVE SEED AND SENTINEL LYMPH NODE BIOPSY (Left) - PEC BLOCK RIGHT BREAST LUMPECTOMY WITH RADIOACTIVE SEED LOCALIZATION (Right) as a surgical intervention.  The patient's history has been reviewed, patient examined, no change in status, stable for surgery.  I have reviewed the patient's chart and labs.  Questions were answered to the patient's satisfaction.     Maia Petties

## 2022-03-05 NOTE — Transfer of Care (Signed)
Immediate Anesthesia Transfer of Care Note  Patient: NUSAIBA GUALLPA  Procedure(s) Performed: LEFT BREAST LUMPECTOMY WITH RADIOACTIVE SEED AND SENTINEL LYMPH NODE BIOPSY (Left: Breast) RIGHT BREAST LUMPECTOMY WITH RADIOACTIVE SEED LOCALIZATION (Right: Breast)  Patient Location: PACU  Anesthesia Type:General  Level of Consciousness: awake, alert , and oriented  Airway & Oxygen Therapy: Patient Spontanous Breathing and Patient connected to face mask oxygen  Post-op Assessment: Report given to RN and Post -op Vital signs reviewed and stable  Post vital signs: Reviewed and stable  Last Vitals:  Vitals Value Taken Time  BP 136/74 03/05/22 1009  Temp    Pulse 73 03/05/22 1010  Resp 10 03/05/22 1010  SpO2 100 % 03/05/22 1010  Vitals shown include unvalidated device data.  Last Pain:  Vitals:   03/05/22 0645  TempSrc: Oral  PainSc: 0-No pain      Patients Stated Pain Goal: 3 (48/18/59 0931)  Complications: No notable events documented.

## 2022-03-05 NOTE — Op Note (Signed)
Pre-op Diagnosis: Left breast invasive ductal carcinoma/right breast complex sclerosing lesion Post-op Diagnosis: same Procedure: Left breast radioactive seed localized lumpectomy and sentinel lymph node biopsy with Magtrace injection Right breast radioactive seed localized lumpectomy Surgeon:  Georgann Bramble K. Assistant:Puja Maczis, PA-C Anesthesia:  GEN - LMA/ PEC block Indications: This is a 57 year old female who was recently diagnosed with left breast cancer.  Annual screening mammogram on 01/24/2022 revealed an indeterminate mass in the right lower inner quadrant.  The left upper outer quadrant shows some architectural distortion.  Diagnostic mammogram on 02/04/2022 revealed a 0.6 cm oval mass in the right breast at 3:00 middle depth.  There is also a 0.6 cm oval mass in the right breast at 1:00 anterior depth.  The left breast revealed 1.2 cm area of architectural distortion.   Ultrasound revealed a 0.5 x 0.5 x 0.4 cm irregular mass in the right breast at 3:00 middle depth.  In the same breast at 1:00, there is a 0.6 x 0.3 x 0.5 cm mass.  The left breast revealed a 0.8 x 0.6 x 0.3 cm irregular mass at 1:00 middle depth.   She underwent biopsies of this area.  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  The right breast mass at 3:00 revealed a complex sclerosing lesion.  Radioactive seeds were placed in each breast yesterday by radiology.  She was injected with technetium sulfur colloid by radiology in the preop area.   Description of procedure: The patient is brought to the operating room placed in supine position on the operating room table. After an adequate level of general anesthesia was obtained, we injected MAC trace into the breast tissue around her nipple.  Her entire chest was prepped with ChloraPrep and draped in sterile fashion. A timeout was taken to ensure the proper patient and proper procedure.   We began on  the right side.  We interrogated the breast with the neoprobe. We made a circumareolar incision around the medial side of the nipple after infiltrating with 0.25% Marcaine. Dissection was carried down in the breast tissue with cautery. We used the neoprobe to guide [place] towards the radioactive seed. We excised an area of tissue around the radioactive seed 2 cm in diameter. The specimen was removed and was oriented with a paint kit. Specimen mammogram showed the radioactive seed as well as the biopsy clip within the specimen. This was sent for pathologic examination. There is no residual radioactivity within the biopsy cavity.  We then turned our attention to the left side.  We interrogated the breast with the neoprobe. We made a circumareolar incision around the upper outer side of the nipple after infiltrating with 0.25% Marcaine. Dissection was carried down in the breast tissue with cautery. We used the neoprobe to guide [place] towards the radioactive seed. We excised an area of tissue around the radioactive seed 2 cm in diameter. The specimen was removed and was oriented with a paint kit. Specimen mammogram showed the radioactive seed as well as the biopsy clip within the specimen. This was sent for pathologic examination. There is no residual radioactivity within the biopsy cavity. Clips were placed in all five margins.  We inspected carefully for hemostasis. The wound was thoroughly irrigated.   We then turned our attention to the axilla.  The settings were adjusted on the Neoprobe and Sentimag probe and we interrogated the axilla.  I made a transverse incision over the area of activity.  We dissected into the axilla and identified a magnetic lymph node.   This was dissected free and sent as "sentinel lymph node #1".  There was no significant radioactivity in this first lymph node.  We interrogated the axilla again and a second node was identified with both neoprobe and Sentimag.  This lymph node was also  removed.  This was sent as "sentinel lymph node #2."  There was minimal background activity.  The wounds were closed with a deep layer of 3-0 Vicryl and a subcuticular layer of 4-0 Monocryl. Benzoin Steri-Strips were applied. The patient was then extubated and brought to the recovery room in stable condition. All sponge, instrument, and needle counts are correct.  Imogene Burn. Georgette Dover, MD, [facility]  General/ Trauma Surgery  12/19/2019 1:38 PM

## 2022-03-05 NOTE — Anesthesia Postprocedure Evaluation (Signed)
Anesthesia Post Note  Patient: EMERLY PRAK  Procedure(s) Performed: LEFT BREAST LUMPECTOMY WITH RADIOACTIVE SEED AND SENTINEL LYMPH NODE BIOPSY (Left: Breast) RIGHT BREAST LUMPECTOMY WITH RADIOACTIVE SEED LOCALIZATION (Right: Breast)     Patient location during evaluation: PACU Anesthesia Type: General Level of consciousness: awake and alert Pain management: pain level controlled Vital Signs Assessment: post-procedure vital signs reviewed and stable Respiratory status: spontaneous breathing, nonlabored ventilation and respiratory function stable Cardiovascular status: blood pressure returned to baseline Postop Assessment: no apparent nausea or vomiting Anesthetic complications: no   No notable events documented.  Last Vitals:  Vitals:   03/05/22 1045 03/05/22 1100  BP: 138/82 133/78  Pulse: 67 73  Resp: 11 14  Temp:    SpO2: 98% 95%    Last Pain:  Vitals:   03/05/22 1100  TempSrc:   PainSc: 0-No pain                 Marthenia Rolling

## 2022-03-06 ENCOUNTER — Encounter (HOSPITAL_BASED_OUTPATIENT_CLINIC_OR_DEPARTMENT_OTHER): Payer: Self-pay | Admitting: Surgery

## 2022-03-06 ENCOUNTER — Encounter: Payer: Self-pay | Admitting: *Deleted

## 2022-03-10 LAB — SURGICAL PATHOLOGY

## 2022-03-11 ENCOUNTER — Telehealth: Payer: Self-pay | Admitting: *Deleted

## 2022-03-11 ENCOUNTER — Encounter: Payer: Self-pay | Admitting: *Deleted

## 2022-03-11 NOTE — Telephone Encounter (Signed)
Received order for oncotype testing. Requisition faxed to pathology 

## 2022-03-12 ENCOUNTER — Telehealth: Payer: Self-pay | Admitting: Licensed Clinical Social Worker

## 2022-03-12 NOTE — Telephone Encounter (Signed)
[facility]  CSW attempted to call patient to check on coping. No answer. Left VM with direct contact information.   Vannessa Godown E Anivea Velasques, LCSW

## 2022-03-13 NOTE — Progress Notes (Signed)
Done

## 2022-03-18 ENCOUNTER — Encounter (HOSPITAL_COMMUNITY): Payer: Self-pay

## 2022-03-23 NOTE — Therapy (Unsigned)
OUTPATIENT PHYSICAL THERAPY BREAST CANCER POST OP FOLLOW UP   Patient Name: Chelsea [name] MRN: [id number] DOB:05/03/1965, 57 y.o., female Today's Date: 03/24/2022  END OF SESSION:  PT End of Session - 03/24/22 1305     Visit Number 2    Number of Visits 2    Date for PT Re-Evaluation 04/16/22    PT Start Time 1000    PT Stop Time [id number]    PT Time Calculation (min) 45 min    Activity Tolerance Patient tolerated treatment well    Behavior During Therapy [facility] for tasks assessed/performed             Past Medical History:  Diagnosis Date   Allergy    Anemia    Depression 2004   History - situational, no meds   Encounter for insertion of mirena IUD 08/04/2008   Enlarged thyroid    being monitored yearly   History of COVID-19 02/2019   Hx of adenomatous polyp of colon 01/23/2016   Hypertriglyceridemia    Multiple thyroid nodules    being monitored yearly   Seasonal allergies    Seasonal allergies    SVD (spontaneous vaginal delivery)    x 2   Past Surgical History:  Procedure Laterality Date   APPENDECTOMY  1988   BREAST LUMPECTOMY WITH RADIOACTIVE SEED AND SENTINEL LYMPH NODE BIOPSY Left 03/05/2022   Procedure: LEFT BREAST LUMPECTOMY WITH RADIOACTIVE SEED AND SENTINEL LYMPH NODE BIOPSY;  Surgeon: Donnie Mesa, MD;  Location: [place];  Service: General;  Laterality: Left;  PEC BLOCK   BREAST LUMPECTOMY WITH RADIOACTIVE SEED LOCALIZATION Right 03/05/2022   Procedure: RIGHT BREAST LUMPECTOMY WITH RADIOACTIVE SEED LOCALIZATION;  Surgeon: Donnie Mesa, MD;  Location: [place];  Service: General;  Laterality: Right;   COLONOSCOPY  04/22/2019   COLONOSCOPY  01/04/2016   COLPOSCOPY  1994   ASCUS   DILATION AND CURETTAGE OF UTERUS N/A 06/13/2014   Procedure: DILATATION AND CURETTAGE;  Surgeon: Nunzio Cobbs, MD;  Location: [facility];  Service: Gynecology;  Laterality: N/A;   HYSTEROSCOPY N/A 06/13/2014   Procedure: HYSTEROSCOPY WITH  NOVASURE ABLATION and removal of malpositioned IUD ;  Surgeon: Nunzio Cobbs, MD;  Location: [facility];  Service: Gynecology;  Laterality: N/A;   LAPAROSCOPIC TUBAL LIGATION N/A 06/13/2014   Procedure: LAPAROSCOPIC BILATERAL TUBAL LIGATION WITH CAUTERY ;  Surgeon: Nunzio Cobbs, MD;  Location: [facility];  Service: Gynecology;  Laterality: N/A;   thyroid biopsy  10/11   hyplerplastic thyroid nodule- multinodular goiter   TUBAL LIGATION  06-13-14   WISDOM TOOTH EXTRACTION     Patient Active Problem List   Diagnosis Date Noted   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  I start radiation after the oncotype comes back.  My underarm is a bit more tender than my breast.  I stopped wearing the bra.   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 It measures 0.8 cm and is located in the upper outer quadrant. It is ER/PR positive and HER2 negative with a Ki67 of 40%. She has a complex sclerosing lesion on the right side.  bil lumpectomy on 03/05/22 with left SLNB with 1 negative node removed.   PATIENT GOALS:  Reassess how my recovery is going related to arm function, pain, and  swelling.  PAIN:  Are you having pain? Yes: NPRS scale: 3/10 Pain location: breast or axilla Pain description: nerve zinging  Aggravating factors: nothing Relieving factors: nothing   PRECAUTIONS: Recent Surgery, left UE Lymphedema risk  ACTIVITY LEVEL / LEISURE: I am back to normal daily activities. Going back to work a full day tomorrow as I tried before and then it was too hard.     OBJECTIVE:   OBSERVATIONS: Increased edema/puffiness left anterior chest/supraclavicular region, lateral breast/axilla at incision. Incisions well healed.   POSTURE:  Rounded shoulders   LYMPHEDEMA ASSESSMENT:   UPPER EXTREMITY AROM/PROM:   A/PROM RIGHT   eval    Shoulder extension 45  Shoulder flexion 158  Shoulder abduction 172  Shoulder internal rotation 52  Shoulder external rotation 90                          (Blank rows = not tested)       A/PROM LEFT   eval 03/24/22  Shoulder extension 45 50  Shoulder flexion 163 163  Shoulder abduction 174 172  Shoulder internal rotation 63   Shoulder external rotation 90 90                          (Blank rows = not tested)   CERVICAL AROM: All within normal limits   UPPER EXTREMITY STRENGTH: WNL   LYMPHEDEMA ASSESSMENTS:    LANDMARK RIGHT   eval  10 cm proximal to olecranon process 27.1  Olecranon process 23.5  10 cm proximal to ulnar styloid process 21.3  Just proximal to ulnar styloid process 15.9  Across hand at thumb web space 18.3  At base of 2nd digit 6.3  (Blank rows = not tested)   LANDMARK LEFT   eval 03/24/22  10 cm proximal to olecranon process 26.7   Olecranon process 22.8   10 cm proximal to ulnar styloid process 20   Just proximal to ulnar styloid process 15.3   Across hand at thumb web space 18.7   At base of 2nd digit 6.2   (Blank rows = not tested)  PATIENT EDUCATION:  Education details: abbreviated MLD: clavicular nodes, bil axillary nodes, anterior interaxillary pathway making sure to include all  edematous regions in the abbreviated version, other post op instruction per below Person educated: Patient Education method: Explanation, Demonstration, Tactile cues, Verbal cues, and Handouts Education comprehension: verbalized understanding, returned demonstration, verbal cues required, and tactile cues required  HOME EXERCISE PROGRAM: Reviewed previously given post op HEP. Abbreviated self MLD  ASSESSMENT:  CLINICAL IMPRESSION: Pt presents 3 weeks post bil lumpectomy and Lt SLNB.  Pt has some collected post op edema in the anterior and lateral breast/chest so she was given a foam rectangle  to use in the compression bra as she had stopped using the bra due to discomfort.  She was also educated on  a brief MLD technique to enhance healing.  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  She will also start radiation soon.  Pt will attempt self treatment but will let PT know if it does not continue to improve.   Pt will benefit from skilled therapeutic intervention to improve on the following deficits: Decreased knowledge of precautions, impaired UE functional use, pain, decreased ROM, postural dysfunction.   PT treatment/interventions: ADL/Self care home management, Therapeutic exercises, Patient/Family education, Self Care, DME instructions, Manual therapy, and Re-evaluation   GOALS: Goals reviewed with patient? Yes  LONG TERM GOALS:  (STG=LTG)  GOALS Name Target Date  Goal status  1 Pt will demonstrate she has regained full shoulder ROM and function post operatively compared to baselines.  Baseline: 03/24/22 INITIAL  2 Pt will be ind with a modified MLD technique for post op edema 03/24/22 INITIAL               PLAN:  PT FREQUENCY/DURATION: SOZO only  PLAN FOR NEXT SESSION: SOZO   [facility]  [address]  [place] [address]  [phone]  After Breast Cancer Class It is recommended you attend the ABC  class to be educated on lymphedema risk reduction. This class is free of charge and lasts for 1 hour. It is a 1-time class. You will need to download the Webex app either on your phone or computer. We will send you a link the night before or the morning of the class. You should be able to click on that link to join the class. This is not a confidential class. You don't have to turn your camera on, but other participants may be able to see your email address.  Scar massage You can begin gentle scar massage to you incision sites. Gently place one hand on the incision and move the skin (without sliding on the skin) in various directions. Do this for a few minutes and then you can gently massage either coconut oil or vitamin E cream into the scars.  Compression garment You should continue wearing your compression bra until you feel like you no longer have swelling.  Home exercise Program Continue doing the exercises you were given until you feel like you can do them without feeling any tightness at the end.   Walking Program Studies show that 30 minutes of walking per day (fast enough to elevate your heart rate) can significantly reduce the risk of a cancer recurrence. If you can't walk due to other medical reasons, we encourage you to find another activity you could do (like a stationary bike or water exercise).  Posture After breast cancer surgery, people frequently sit with rounded shoulders posture because it puts their incisions on slack and feels better. If you sit like this and scar tissue forms in that position, you can become very tight and have pain sitting or standing with good posture. Try to be aware of your posture and sit and stand up tall to heal properly.  Follow up PT: It is recommended you return every 3 months for the first 3 years following surgery to be assessed on the SOZO machine for an L-Dex score. This helps prevent clinically significant lymphedema in 95% of patients. These  follow up screens are 10 minute appointments that you are not  billed for.  Stark Bray, PT 03/24/2022, 9:38 PM

## 2022-03-24 ENCOUNTER — Encounter: Payer: Self-pay | Admitting: Hematology and Oncology

## 2022-03-24 ENCOUNTER — Other Ambulatory Visit: Payer: Self-pay

## 2022-03-24 ENCOUNTER — Encounter: Payer: Self-pay | Admitting: Rehabilitation

## 2022-03-24 ENCOUNTER — Ambulatory Visit: Payer: BC Managed Care – PPO | Attending: Surgery | Admitting: Rehabilitation

## 2022-03-24 ENCOUNTER — Inpatient Hospital Stay: Payer: BC Managed Care – PPO | Admitting: Hematology and Oncology

## 2022-03-24 VITALS — BP 142/86 | HR 74 | Temp 97.7°F | Resp 16 | Ht 68.0 in | Wt 174.3 lb

## 2022-03-24 DIAGNOSIS — Z483 Aftercare following surgery for neoplasm: Secondary | ICD-10-CM | POA: Diagnosis present

## 2022-03-24 DIAGNOSIS — Z170 Estrogen receptor positive status [ER+]: Secondary | ICD-10-CM | POA: Insufficient documentation

## 2022-03-24 DIAGNOSIS — C50412 Malignant neoplasm of upper-outer quadrant of left female breast: Secondary | ICD-10-CM

## 2022-03-24 DIAGNOSIS — Z9189 Other specified personal risk factors, not elsewhere classified: Secondary | ICD-10-CM

## 2022-03-24 DIAGNOSIS — C50212 Malignant neoplasm of upper-inner quadrant of left female breast: Secondary | ICD-10-CM | POA: Diagnosis not present

## 2022-03-24 DIAGNOSIS — R293 Abnormal posture: Secondary | ICD-10-CM | POA: Diagnosis present

## 2022-03-24 NOTE — Progress Notes (Signed)
[facility] NOTE  Patient Care Team: Kathyrn Lass, MD as PCP - General (Family Medicine) Lorelle Gibbs, MD (Radiology) Yisroel Ramming, Everardo All, MD as Consulting Physician (Obstetrics and Gynecology) Mauro Kaufmann, RN as Oncology Nurse Navigator Rockwell Germany, RN as Oncology Nurse Navigator Benay Pike, MD as Consulting Physician (Hematology and Oncology) Kyung Rudd, MD as Consulting Physician (Radiation Oncology) Donnie Mesa, MD as Consulting Physician (General Surgery)  CHIEF COMPLAINTS/PURPOSE OF CONSULTATION:  Breast cancer follow up  HISTORY OF PRESENTING ILLNESS:  Chelsea [name] 57 y.o. female is here because of recent diagnosis of left breast cancer  I reviewed her records extensively and collaborated the history with the patient.  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  Ultrasound of the breast confirmed 5 x 5 x 4 mm irregular mass in right breast at 3:00 middle depth suspicious for malignancy.  Biopsy recommended.  6 x 3 x 5 mm irregular mass in the right breast at 1:00 anterior depth suspicious of malignancy, biopsy recommended.  8 x 6 x 3 mm irregular mass in the left breast at 1:00 middle depth suspicious of malignancy.  Biopsy recommended   02/10/2022 Pathology Results   Left breast needle core biopsy at 1:00 5 cm from the nipple shows invasive mammary carcinoma, overall grade 2.  Right breast needle core biopsy at 3:00 3 cm from the nipple shows CSL.  Right breast needle core biopsy 1:00 2 cm from nipple shows benign breast parenchyma with PASH, no malignancy identified.  Prognostics from the left breast biopsy showed ER 80% positive strong staining PR 50% positive strong staining Ki-67 of 40%  and HER2 negative by IHC.   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  Left breast lumpectomy showed 2.1 cm grade 2 IDC, DCIS, negative margins, no lymphovascular invasion.  Prior prognostics ER +80% PR +50% and HER2 negative Oncotype sent, pending results.  MEDICAL HISTORY:  Past Medical History:  Diagnosis Date   Allergy    Anemia    Depression 2004   History - situational, no meds   Encounter for insertion of mirena IUD 08/04/2008   Enlarged thyroid    being monitored yearly   History of COVID-19 02/2019   Hx of adenomatous polyp of colon 01/23/2016   Hypertriglyceridemia    Multiple thyroid nodules    being monitored yearly   Seasonal allergies    Seasonal allergies    SVD (spontaneous vaginal delivery)    x 2    SURGICAL HISTORY: Past Surgical History:  Procedure Laterality Date   APPENDECTOMY  1988   BREAST LUMPECTOMY WITH RADIOACTIVE SEED AND SENTINEL LYMPH NODE BIOPSY Left 03/05/2022   Procedure: LEFT BREAST LUMPECTOMY WITH RADIOACTIVE SEED AND SENTINEL LYMPH NODE BIOPSY;  Surgeon: Donnie Mesa, MD;  Location: [facility];  Service: General;  Laterality: Left;  PEC BLOCK   BREAST LUMPECTOMY WITH RADIOACTIVE SEED LOCALIZATION Right 03/05/2022   Procedure: RIGHT BREAST LUMPECTOMY WITH RADIOACTIVE SEED LOCALIZATION;  Surgeon: Donnie Mesa, MD;  Location: [place];  Service: General;  Laterality: Right;   COLONOSCOPY  04/22/2019   COLONOSCOPY   01/04/2016   COLPOSCOPY  1994   ASCUS   DILATION AND CURETTAGE OF UTERUS N/A 06/13/2014   Procedure: DILATATION AND CURETTAGE;  Surgeon: Nunzio Cobbs, MD;  Location: [facility];  Service: Gynecology;  Laterality: N/A;   HYSTEROSCOPY N/A 06/13/2014   Procedure: HYSTEROSCOPY WITH NOVASURE ABLATION and removal of malpositioned IUD ;  Surgeon: Nunzio Cobbs, MD;  Location: [facility];  Service: Gynecology;  Laterality: N/A;   LAPAROSCOPIC TUBAL LIGATION N/A 06/13/2014   Procedure: LAPAROSCOPIC BILATERAL TUBAL LIGATION WITH CAUTERY ;  Surgeon: Nunzio Cobbs, MD;  Location: [facility];  Service: Gynecology;  Laterality: N/A;   thyroid biopsy  10/11   hyplerplastic thyroid nodule- multinodular goiter   TUBAL LIGATION  06-13-14   WISDOM TOOTH EXTRACTION      SOCIAL HISTORY: Social History   Socioeconomic History   Marital status: Divorced    Spouse name: Not on file   Number of children: Not on file   Years of education: Not on file   Highest education level: Not on file  Occupational History   Not on file  Tobacco Use   Smoking status: Never   Smokeless tobacco: Never  Vaping Use   Vaping Use: Never used  Substance and Sexual Activity   Alcohol use: Yes    Alcohol/week: 2.0 - 3.0 standard drinks of alcohol    Types: 2 - 3 Glasses of wine per week   Drug use: No   Sexual activity: Yes    Partners: Male    Birth control/protection: Surgical    Comment: BTL  Other Topics Concern   Not on file  Social History Narrative   Not on file   Social Determinants of Health   Financial Resource Strain: Not on file  Food Insecurity: No Food Insecurity (02/19/2022)   Hunger Vital Sign    Worried About Running Out of Food in the Last Year: Never true    Ran Out of Food in the Last Year: Never true  Transportation Needs: No Transportation Needs (02/19/2022)   PRAPARE - Hydrologist (Medical): No    Lack of Transportation (Non-Medical): No   Physical Activity: Inactive (02/19/2022)   Exercise Vital Sign    Days of Exercise per Week: 0 days    Minutes of Exercise per Session: 0 min  Stress: Stress Concern Present (02/19/2022)   [place]    Feeling of Stress : Rather much  Social Connections: Not on file  Intimate Partner Violence: Not on file    FAMILY HISTORY: Family History  Problem Relation Age of Onset   Hypertension Mother    Hypertension Father    Colon polyps Father    Colon cancer Neg Hx    Rectal cancer Neg Hx    Esophageal cancer Neg Hx    Stomach cancer Neg Hx     ALLERGIES:  is allergic to elemental sulfur, codeine, mupirocin, percocet [oxycodone-acetaminophen], and sulfa antibiotics.  MEDICATIONS:  Current Outpatient Medications  Medication Sig Dispense Refill   Ibuprofen 200 MG CAPS Take by mouth.  methocarbamol (ROBAXIN) 500 MG tablet Take 1 tablet by mouth as needed. c     naproxen (NAPROSYN) 500 MG tablet Take 500 mg by mouth 2 (two) times daily as needed. Reported on 02/26/2015  2   traMADol (ULTRAM) 50 MG tablet Take 1 tablet (50 mg total) by mouth every 6 (six) hours as needed for moderate pain or severe pain. 20 tablet 0   No current facility-administered medications for this visit.    REVIEW OF SYSTEMS:   Constitutional: Denies fevers, chills or abnormal night sweats Eyes: Denies blurriness of vision, double vision or watery eyes Ears, nose, mouth, throat, and face: Denies mucositis or sore throat Respiratory: Denies cough, dyspnea or wheezes Cardiovascular: Denies palpitation, chest discomfort or lower extremity swelling Gastrointestinal:  Denies nausea, heartburn or change in bowel habits Skin: Denies abnormal skin rashes Lymphatics: Denies new lymphadenopathy or easy bruising Neurological:Denies numbness, tingling or new weaknesses Behavioral/Psych: Mood is stable, no new changes  Breast: Denies any palpable lumps  or discharge All other systems were reviewed with the patient and are negative.  PHYSICAL EXAMINATION: ECOG PERFORMANCE STATUS: 0 - Asymptomatic  Vitals:   03/24/22 0843  BP: (!) 142/86  Pulse: 74  Resp: 16  Temp: 97.7 F (36.5 C)  SpO2: 99%    Filed Weights   03/24/22 0843  Weight: 174 lb 4.8 oz (79.1 kg)     GENERAL:alert, no distress and comfortable Rest of the PE deferred in lieu of counseling  LABORATORY DATA:  I have reviewed the data as listed Lab Results  Component Value Date   WBC 5.1 02/19/2022   HGB 14.4 02/19/2022   HCT 41.5 02/19/2022   MCV 94.5 02/19/2022   PLT 311 02/19/2022   Lab Results  Component Value Date   NA 140 02/19/2022   K 4.1 02/19/2022   CL 104 02/19/2022   CO2 29 02/19/2022    RADIOGRAPHIC STUDIES: I have personally reviewed the radiological reports and agreed with the findings in the report.  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  Biopsy from the 2 right-sided breast masses suggestive of CSL and patch with no evidence of malignancy.  Given tumor measuring more than 5 mm, we have discussed about Oncotype testing.  I have discussed the following details about Oncotype. She is now status post lumpectomy, final pathology from the left breast showed 2.1 cm grade 2 IDC, ER/PR positive HER2 negative, negative margins.  Oncotype was sent and is pending.  At this time we have once again discussed about awaiting Oncotype results before proceeding with adjuvant radiation.  If she is not deemed to be a candidate for adjuvant chemotherapy, then she will proceed with radiation followed by antiestrogen therapy. We will call her with oncotype results I have once again discussed  about Tamoxifen, mechanism of action, adverse effects of Tamoxifen including post menopausal symptoms, DVT, PE, endometrial hyperplasia and endometrial cancer. RTC in about 6 weeks.  Total time spent: 20 minutes including history, physical exam, review of records, counseling and coordination of care All questions were answered. The patient knows to call the clinic with any problems, questions or concerns.    Benay Pike, MD 03/24/22

## 2022-03-24 NOTE — Assessment & Plan Note (Addendum)
This is a very pleasant 57 year old female patient with newly diagnosed left breast invasive ductal carcinoma, overall grade 2, ER/PR positive, HER2 negative, Ki-67 of 40% referred to breast [place] for recommendations.  Biopsy from the 2 right-sided breast masses suggestive of CSL and patch with no evidence of malignancy.  Given tumor measuring more than 5 mm, we have discussed about Oncotype testing.  I have discussed the following details about Oncotype. She is now status post lumpectomy, final pathology from the left breast showed 2.1 cm grade 2 IDC, ER/PR positive HER2 negative, negative margins.  Oncotype was sent and is pending.  At this time we have once again discussed about awaiting Oncotype results before proceeding with adjuvant radiation.  If she is not deemed to be a candidate for adjuvant chemotherapy, then she will proceed with radiation followed by antiestrogen therapy. We will call her with oncotype results I have once again discussed about Tamoxifen, mechanism of action, adverse effects of Tamoxifen including post menopausal symptoms, DVT, PE, endometrial hyperplasia and endometrial cancer. RTC in about 6 weeks.

## 2022-03-25 ENCOUNTER — Telehealth: Payer: Self-pay | Admitting: Hematology and Oncology

## 2022-03-25 NOTE — Telephone Encounter (Signed)
Patient aware of upcoming appointment

## 2022-03-26 ENCOUNTER — Encounter: Payer: Self-pay | Admitting: Hematology and Oncology

## 2022-04-02 ENCOUNTER — Telehealth: Payer: Self-pay | Admitting: *Deleted

## 2022-04-02 ENCOUNTER — Encounter (HOSPITAL_COMMUNITY): Payer: Self-pay

## 2022-04-02 DIAGNOSIS — C50212 Malignant neoplasm of upper-inner quadrant of left female breast: Secondary | ICD-10-CM

## 2022-04-02 NOTE — Telephone Encounter (Signed)
Received oncotype score of 24. Physician team notified. Called pt with results. Discussed chemo not recommended.  Informed next step is xrt with Dr. Lisbeth Renshaw. Received verbal understanding. No further needs or questions voiced at this time. Referral placed for pt to see Dr. Lisbeth Renshaw

## 2022-04-02 NOTE — Telephone Encounter (Signed)
VM left on this RN's phone from the patient stating she is calling regarding the onco type result - she can see it has resulted but not viewable via [place].  This note will be forwarded to navigators.

## 2022-04-04 ENCOUNTER — Ambulatory Visit
Admission: RE | Admit: 2022-04-04 | Discharge: 2022-04-04 | Disposition: A | Discharge status: Home / Self Care | Payer: Self-pay | Source: Ambulatory Visit | Attending: Radiation Oncology | Admitting: Radiation Oncology

## 2022-04-04 ENCOUNTER — Inpatient Hospital Stay
Admission: RE | Admit: 2022-04-04 | Discharge: 2022-04-04 | Disposition: A | Discharge status: Home / Self Care | Payer: Self-pay | Source: Ambulatory Visit | Attending: Radiation Oncology | Admitting: Radiation Oncology

## 2022-04-04 ENCOUNTER — Other Ambulatory Visit: Payer: Self-pay | Admitting: Radiation Oncology

## 2022-04-04 DIAGNOSIS — C50212 Malignant neoplasm of upper-inner quadrant of left female breast: Secondary | ICD-10-CM

## 2022-04-10 ENCOUNTER — Ambulatory Visit
Admission: RE | Admit: 2022-04-10 | Discharge: 2022-04-10 | Disposition: A | Discharge status: Home / Self Care | Payer: BC Managed Care – PPO | Source: Ambulatory Visit | Attending: Radiation Oncology | Admitting: Radiation Oncology

## 2022-04-10 ENCOUNTER — Encounter: Payer: Self-pay | Admitting: Radiation Oncology

## 2022-04-10 ENCOUNTER — Other Ambulatory Visit: Payer: Self-pay

## 2022-04-10 VITALS — BP 142/77 | HR 80 | Temp 96.9°F | Resp 18 | Ht 68.0 in | Wt 172.1 lb

## 2022-04-10 DIAGNOSIS — C50212 Malignant neoplasm of upper-inner quadrant of left female breast: Secondary | ICD-10-CM | POA: Insufficient documentation

## 2022-04-10 DIAGNOSIS — Z170 Estrogen receptor positive status [ER+]: Secondary | ICD-10-CM | POA: Insufficient documentation

## 2022-04-10 DIAGNOSIS — E042 Nontoxic multinodular goiter: Secondary | ICD-10-CM | POA: Diagnosis not present

## 2022-04-10 DIAGNOSIS — Z79899 Other long term (current) drug therapy: Secondary | ICD-10-CM | POA: Insufficient documentation

## 2022-04-10 DIAGNOSIS — Z510 Encounter for antineoplastic radiation therapy: Secondary | ICD-10-CM | POA: Diagnosis present

## 2022-04-10 DIAGNOSIS — E781 Pure hyperglyceridemia: Secondary | ICD-10-CM | POA: Diagnosis not present

## 2022-04-10 NOTE — Progress Notes (Signed)
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   HISTORY OF PRESENT ILLNESS: Chelsea [name] is a 57 y.o. female seen in the multidisciplinary breast clinic for a new diagnosis of left breast cancer. The patient was found on screening mammogram to have an oval mass in the right breast as well as architectural distortion in the left breast on screening mammography in December 2023.  Diagnostic mammography of both breasts on 02/04/2022 showed a 6 mm oval mass in the 3 o'clock position, and a 6 mm mass in the 1 o'clock position both of the sites were in the right breast, the architectural distortion in the left breast measured 1.2 cm in the upper outer quadrant.  By ultrasound in the right breast there was a 5 mm mass in the 3 o'clock position, and a a 6 mm mass in the 1:00 position. The right axilla was negative for adenopathy. The left breast had an 8 mm mass in the 1:00 position and the left axilla was negative for adenopathy.   A biopsy on 02/10/22 showed a complex sclerosing lesion in the right 3:00 biopsy, and benign breast parenchyma with pseudo and adenomatous stromal hyperplasia in the 1:00 right breast specimen.  The left breast however showed an invasive mammary carcinoma that was grade 2.  Prognostic showed ER/PR positivity, HER2 was negative and Ki-67 was 40%.    Since her last visit she underwent left lumpectomy with sentinel node biopsy and right lumpectomy on 03/05/22. Her right lumpectomy showed a 1 cm complex sclerosing lesion without malignancy. Her left breast lumpectomy showed a grade 2, invasive ductal carcinoma measuring 2.1 cm, and her margins were negative. There  was associated DCIS that was intermediate grade and margins were negative, with DCIS at 2 mm from the superior margin. She had one node that was sampled was negative for disease. Her Oncotype Dx was 24 and no chemotherapy is recommended. She's seen today to discuss adjuvant radiation.     PREVIOUS RADIATION THERAPY: No   PAST MEDICAL HISTORY:  Past Medical History:  Diagnosis Date   Allergy    Anemia    Depression 2004   History - situational, no meds   Encounter for insertion of mirena IUD 08/04/2008   Enlarged thyroid    being monitored yearly   History of COVID-19 02/2019   Hx of adenomatous polyp of colon 01/23/2016   Hypertriglyceridemia    Multiple thyroid nodules    being monitored yearly   Seasonal allergies    Seasonal allergies    SVD (spontaneous vaginal delivery)    x 2       PAST SURGICAL HISTORY: Past Surgical History:  Procedure Laterality Date   APPENDECTOMY  1988   BREAST LUMPECTOMY WITH RADIOACTIVE SEED AND SENTINEL LYMPH NODE BIOPSY Left 03/05/2022   Procedure: LEFT BREAST LUMPECTOMY WITH RADIOACTIVE SEED AND SENTINEL LYMPH NODE BIOPSY;  Surgeon: Donnie Mesa, MD;  Location: [facility];  Service: General;  Laterality: Left;  PEC BLOCK   BREAST LUMPECTOMY WITH RADIOACTIVE SEED LOCALIZATION Right 03/05/2022   Procedure: RIGHT BREAST LUMPECTOMY WITH RADIOACTIVE SEED LOCALIZATION;  Surgeon: Donnie Mesa, MD;  Location: [facility];  Service: General;  Laterality: Right;   COLONOSCOPY  04/22/2019   COLONOSCOPY  01/04/2016   COLPOSCOPY  1994   ASCUS   DILATION AND CURETTAGE OF UTERUS N/A 06/13/2014   Procedure: DILATATION AND CURETTAGE;  Surgeon: Nunzio Cobbs, MD;  Location: [facility];  Service: Gynecology;  Laterality: N/A;   HYSTEROSCOPY N/A 06/13/2014   Procedure: HYSTEROSCOPY WITH NOVASURE ABLATION and removal of malpositioned IUD ;  Surgeon: Nunzio Cobbs, MD;  Location: [facility];  Service: Gynecology;   Laterality: N/A;   LAPAROSCOPIC TUBAL LIGATION N/A 06/13/2014   Procedure: LAPAROSCOPIC BILATERAL TUBAL LIGATION WITH CAUTERY ;  Surgeon: Nunzio Cobbs, MD;  Location: [facility];  Service: Gynecology;  Laterality: N/A;   thyroid biopsy  10/11   hyplerplastic thyroid nodule- multinodular goiter   TUBAL LIGATION  06-13-14   WISDOM TOOTH EXTRACTION       FAMILY HISTORY:  Family History  Problem Relation Age of Onset   Hypertension Mother    Hypertension Father    Colon polyps Father    Colon cancer Neg Hx    Rectal cancer Neg Hx    Esophageal cancer Neg Hx    Stomach cancer Neg Hx      SOCIAL HISTORY:  reports that she has never smoked. She has never used smokeless tobacco. She reports current alcohol use of about 2.0 - 3.0 standard drinks of alcohol per week. She reports that she does not use drugs. The [place] She is a Licensed conveyancer at [employer]. She has two adult daughters who live nearby.   ALLERGIES: Elemental sulfur, Codeine, Mupirocin, Percocet [oxycodone-acetaminophen], and Sulfa antibiotics   MEDICATIONS:  Current Outpatient Medications  Medication Sig Dispense Refill   Ibuprofen 200 MG CAPS Take by mouth.     methocarbamol (ROBAXIN) 500 MG tablet Take 1 tablet by mouth as needed. c     naproxen (NAPROSYN) 500 MG tablet Take 500 mg by mouth 2 (two) times daily as needed. Reported on 02/26/2015  2   traMADol (ULTRAM) 50 MG tablet Take 1 tablet (50 mg total) by mouth every 6 (six) hours as needed for moderate pain or severe pain. 20 tablet 0   No current facility-administered medications for this encounter.     REVIEW OF SYSTEMS: On review of systems, the patient reports that she is healing nicely but was very tired going back to work early after her surgery. No other breast specific complaints are verbalized.       PHYSICAL EXAM:  Wt Readings from Last 3 Encounters:  04/10/22 172 lb 2 oz (78.1 kg)  03/24/22  174 lb 4.8 oz (79.1 kg)  03/05/22 173 lb 1 oz (78.5 kg)   Temp Readings from Last 3 Encounters:  04/10/22 (!) 96.9 F (36.1 C) (Temporal)  03/24/22 97.7 F (36.5 C)  03/05/22 (!) 97.4 F (36.3 C)   BP Readings from Last 3 Encounters:  04/10/22 (!) 142/77  03/24/22 (!) 142/86  03/05/22 133/77   Pulse Readings from Last 3 Encounters:  04/10/22 80  03/24/22 74  03/05/22 76    In general this is a well appearing female who appears younger than her stated age in no acute distress. She's alert and oriented x4 and appropriate throughout the examination. Cardiopulmonary assessment is negative for acute distress and she exhibits normal effort. Her left breast reveals a well healed surgical incision of the breast  and axilla without erythema, separation, or drainage.     ECOG = 1  0 - Asymptomatic (Fully active, able to carry on all predisease activities without restriction)  1 - Symptomatic but completely ambulatory (Restricted in physically strenuous activity but ambulatory and able to carry out work of a light or sedentary nature. For example, light housework, office work)  2 - Symptomatic, <50% in bed during the day (Ambulatory and capable of all self care but unable to carry out any work activities. Up and about more than 50% of waking hours)  3 - Symptomatic, >50% in bed, but not bedbound (Capable of only limited self-care, confined to bed or chair 50% or more of waking hours)  4 - Bedbound (Completely disabled. Cannot carry on any self-care. Totally confined to bed or chair)  5 - Death   Eustace Pen MM, Creech RH, Tormey DC, et al. [phone]). "Toxicity and response criteria of the [facility]". [place] Oncol. 5 (6): 649-55    LABORATORY DATA:  Lab Results  Component Value Date   WBC 5.1 02/19/2022   HGB 14.4 02/19/2022   HCT 41.5 02/19/2022   MCV 94.5 02/19/2022   PLT 311 02/19/2022   Lab Results  Component Value Date   NA 140 02/19/2022   K 4.1  02/19/2022   CL 104 02/19/2022   CO2 29 02/19/2022   Lab Results  Component Value Date   ALT 24 02/19/2022   AST 21 02/19/2022   ALKPHOS 58 02/19/2022   BILITOT 0.4 02/19/2022      RADIOGRAPHY: No results found.     IMPRESSION/PLAN: 1. Stage IA, pT1cN0M0, grade 2, ER/PR positive invasive mammary carcinoma of the left breast. Dr. Lisbeth Renshaw has reviewed her final pathology findings and today we discussed early stage left breast disease. She has done well since surgery and does not need systemic chemotherapy based on her Oncotype Dx score. We reviewed Dr. Ida Rogue recommendations for adjuvant external radiotherapy to the breast  to reduce risks of local recurrence followed by antiestrogen therapy. We discussed the risks, benefits, short, and long term effects of radiotherapy, as well as the curative intent, and the patient is interested in proceeding. We reviewed the delivery and logistics of radiotherapy and Dr. Lisbeth Renshaw recommends 4 weeks of radiotherapy to the left breast with deep inspiration breath hold technique. Written consent is obtained and placed in the chart, a copy was provided to the patient. She will simulate today.    In a visit lasting 45 minutes, greater than 50% of the time was spent face to face reviewing her case, as well as in preparation of, discussing, and coordinating the patient's care.      Carola Rhine, [facility]    **Disclaimer: This note was dictated with voice recognition software. Similar sounding words can inadvertently be transcribed and this note may contain transcription errors which may not have been corrected upon publication of note.**

## 2022-04-10 NOTE — Progress Notes (Signed)
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  Patient identity verified. Patient reports mild redness around LT areola at incision line. No other issues conveyed at this time.  Meaningful use complete. LMP-12/2022 Patient states "No chances of pregnancy."  Vitals- BP (!) 142/77 (BP Location: Right Arm, Patient Position: Sitting, Cuff Size: Normal)   Pulse 80   Temp (!) 96.9 F (36.1 C) (Temporal)   Resp 18   Ht '5\' 8"'$  (1.727 m)   Wt 172 lb 2 oz (78.1 kg)   SpO2 97%   BMI 26.17 kg/m   This concludes the interview.   Leandra Kern, LPN

## 2022-04-11 ENCOUNTER — Encounter: Payer: Self-pay | Admitting: *Deleted

## 2022-04-18 DIAGNOSIS — Z510 Encounter for antineoplastic radiation therapy: Secondary | ICD-10-CM | POA: Diagnosis not present

## 2022-04-24 ENCOUNTER — Ambulatory Visit
Admission: RE | Admit: 2022-04-24 | Discharge: 2022-04-24 | Disposition: A | Discharge status: Home / Self Care | Payer: BC Managed Care – PPO | Source: Ambulatory Visit | Attending: Radiation Oncology | Admitting: Radiation Oncology

## 2022-04-24 ENCOUNTER — Other Ambulatory Visit: Payer: Self-pay

## 2022-04-24 DIAGNOSIS — Z510 Encounter for antineoplastic radiation therapy: Secondary | ICD-10-CM | POA: Diagnosis not present

## 2022-04-24 LAB — RAD ONC ARIA SESSION SUMMARY
Course Elapsed Days: 0
Plan Fractions Treated to Date: 1
Plan Prescribed Dose Per Fraction: 2.66 Gy
Plan Total Fractions Prescribed: 16
Plan Total Prescribed Dose: 42.56 Gy
Reference Point Dosage Given to Date: 2.66 Gy
Reference Point Session Dosage Given: 2.66 Gy
Session Number: 1

## 2022-04-25 ENCOUNTER — Ambulatory Visit
Admission: RE | Admit: 2022-04-25 | Discharge: 2022-04-25 | Disposition: A | Discharge status: Home / Self Care | Payer: BC Managed Care – PPO | Source: Ambulatory Visit | Attending: Radiation Oncology

## 2022-04-25 ENCOUNTER — Ambulatory Visit
Admission: RE | Admit: 2022-04-25 | Discharge: 2022-04-25 | Disposition: A | Discharge status: Home / Self Care | Payer: BC Managed Care – PPO | Source: Ambulatory Visit | Attending: Radiation Oncology | Admitting: Radiation Oncology

## 2022-04-25 ENCOUNTER — Other Ambulatory Visit: Payer: Self-pay

## 2022-04-25 DIAGNOSIS — Z510 Encounter for antineoplastic radiation therapy: Secondary | ICD-10-CM | POA: Diagnosis not present

## 2022-04-25 DIAGNOSIS — C50212 Malignant neoplasm of upper-inner quadrant of left female breast: Secondary | ICD-10-CM

## 2022-04-25 LAB — RAD ONC ARIA SESSION SUMMARY
Course Elapsed Days: 1
Plan Fractions Treated to Date: 2
Plan Prescribed Dose Per Fraction: 2.66 Gy
Plan Total Fractions Prescribed: 16
Plan Total Prescribed Dose: 42.56 Gy
Reference Point Dosage Given to Date: 5.32 Gy
Reference Point Session Dosage Given: 2.66 Gy
Session Number: 2

## 2022-04-25 MED ORDER — ALRA NON-METALLIC DEODORANT (RAD-ONC)
1.0000 | Freq: Once | TOPICAL | Status: AC
  Administered 2022-04-25: 1 via TOPICAL

## 2022-04-25 MED ORDER — RADIAPLEXRX EX GEL: Freq: Once | CUTANEOUS | Status: AC

## 2022-04-28 ENCOUNTER — Other Ambulatory Visit: Payer: Self-pay

## 2022-04-28 ENCOUNTER — Ambulatory Visit
Admission: RE | Admit: 2022-04-28 | Discharge: 2022-04-28 | Disposition: A | Discharge status: Home / Self Care | Payer: BC Managed Care – PPO | Source: Ambulatory Visit | Attending: Radiation Oncology | Admitting: Radiation Oncology

## 2022-04-28 DIAGNOSIS — Z170 Estrogen receptor positive status [ER+]: Secondary | ICD-10-CM | POA: Insufficient documentation

## 2022-04-28 DIAGNOSIS — C50212 Malignant neoplasm of upper-inner quadrant of left female breast: Secondary | ICD-10-CM | POA: Insufficient documentation

## 2022-04-28 DIAGNOSIS — Z510 Encounter for antineoplastic radiation therapy: Secondary | ICD-10-CM | POA: Diagnosis present

## 2022-04-28 LAB — RAD ONC ARIA SESSION SUMMARY
Course Elapsed Days: 4
Plan Fractions Treated to Date: 3
Plan Prescribed Dose Per Fraction: 2.66 Gy
Plan Total Fractions Prescribed: 16
Plan Total Prescribed Dose: 42.56 Gy
Reference Point Dosage Given to Date: 7.98 Gy
Reference Point Session Dosage Given: 2.66 Gy
Session Number: 3

## 2022-04-29 ENCOUNTER — Other Ambulatory Visit: Payer: Self-pay

## 2022-04-29 ENCOUNTER — Ambulatory Visit
Admission: RE | Admit: 2022-04-29 | Discharge: 2022-04-29 | Disposition: A | Discharge status: Home / Self Care | Payer: BC Managed Care – PPO | Source: Ambulatory Visit | Attending: Radiation Oncology | Admitting: Radiation Oncology

## 2022-04-29 DIAGNOSIS — Z510 Encounter for antineoplastic radiation therapy: Secondary | ICD-10-CM | POA: Diagnosis not present

## 2022-04-29 LAB — RAD ONC ARIA SESSION SUMMARY
Course Elapsed Days: 5
Plan Fractions Treated to Date: 4
Plan Prescribed Dose Per Fraction: 2.66 Gy
Plan Total Fractions Prescribed: 16
Plan Total Prescribed Dose: 42.56 Gy
Reference Point Dosage Given to Date: 10.64 Gy
Reference Point Session Dosage Given: 2.66 Gy
Session Number: 4

## 2022-04-30 ENCOUNTER — Ambulatory Visit
Admission: RE | Admit: 2022-04-30 | Discharge: 2022-04-30 | Disposition: A | Discharge status: Home / Self Care | Payer: BC Managed Care – PPO | Source: Ambulatory Visit | Attending: Radiation Oncology | Admitting: Radiation Oncology

## 2022-04-30 ENCOUNTER — Other Ambulatory Visit: Payer: Self-pay

## 2022-04-30 DIAGNOSIS — Z510 Encounter for antineoplastic radiation therapy: Secondary | ICD-10-CM | POA: Diagnosis not present

## 2022-04-30 LAB — RAD ONC ARIA SESSION SUMMARY
Course Elapsed Days: 6
Plan Fractions Treated to Date: 5
Plan Prescribed Dose Per Fraction: 2.66 Gy
Plan Total Fractions Prescribed: 16
Plan Total Prescribed Dose: 42.56 Gy
Reference Point Dosage Given to Date: 13.3 Gy
Reference Point Session Dosage Given: 2.66 Gy
Session Number: 5

## 2022-05-01 ENCOUNTER — Other Ambulatory Visit: Payer: Self-pay

## 2022-05-01 ENCOUNTER — Ambulatory Visit
Admission: RE | Admit: 2022-05-01 | Discharge: 2022-05-01 | Disposition: A | Discharge status: Home / Self Care | Payer: BC Managed Care – PPO | Source: Ambulatory Visit | Attending: Radiation Oncology | Admitting: Radiation Oncology

## 2022-05-01 DIAGNOSIS — Z510 Encounter for antineoplastic radiation therapy: Secondary | ICD-10-CM | POA: Diagnosis not present

## 2022-05-01 LAB — RAD ONC ARIA SESSION SUMMARY
Course Elapsed Days: 7
Plan Fractions Treated to Date: 6
Plan Prescribed Dose Per Fraction: 2.66 Gy
Plan Total Fractions Prescribed: 16
Plan Total Prescribed Dose: 42.56 Gy
Reference Point Dosage Given to Date: 15.96 Gy
Reference Point Session Dosage Given: 2.66 Gy
Session Number: 6

## 2022-05-02 ENCOUNTER — Other Ambulatory Visit: Payer: Self-pay

## 2022-05-02 ENCOUNTER — Ambulatory Visit
Admission: RE | Admit: 2022-05-02 | Discharge: 2022-05-02 | Disposition: A | Discharge status: Home / Self Care | Payer: BC Managed Care – PPO | Source: Ambulatory Visit | Attending: Radiation Oncology | Admitting: Radiation Oncology

## 2022-05-02 DIAGNOSIS — Z510 Encounter for antineoplastic radiation therapy: Secondary | ICD-10-CM | POA: Diagnosis not present

## 2022-05-02 LAB — RAD ONC ARIA SESSION SUMMARY
Course Elapsed Days: 8
Plan Fractions Treated to Date: 7
Plan Prescribed Dose Per Fraction: 2.66 Gy
Plan Total Fractions Prescribed: 16
Plan Total Prescribed Dose: 42.56 Gy
Reference Point Dosage Given to Date: 18.62 Gy
Reference Point Session Dosage Given: 2.66 Gy
Session Number: 7

## 2022-05-05 ENCOUNTER — Ambulatory Visit
Admission: RE | Admit: 2022-05-05 | Discharge: 2022-05-05 | Disposition: A | Discharge status: Home / Self Care | Payer: BC Managed Care – PPO | Source: Ambulatory Visit | Attending: Radiation Oncology | Admitting: Radiation Oncology

## 2022-05-05 ENCOUNTER — Other Ambulatory Visit: Payer: Self-pay

## 2022-05-05 DIAGNOSIS — Z510 Encounter for antineoplastic radiation therapy: Secondary | ICD-10-CM | POA: Diagnosis not present

## 2022-05-05 LAB — RAD ONC ARIA SESSION SUMMARY
Course Elapsed Days: 11
Plan Fractions Treated to Date: 8
Plan Prescribed Dose Per Fraction: 2.66 Gy
Plan Total Fractions Prescribed: 16
Plan Total Prescribed Dose: 42.56 Gy
Reference Point Dosage Given to Date: 21.28 Gy
Reference Point Session Dosage Given: 2.66 Gy
Session Number: 8

## 2022-05-06 ENCOUNTER — Other Ambulatory Visit: Payer: Self-pay

## 2022-05-06 ENCOUNTER — Inpatient Hospital Stay: Payer: BC Managed Care – PPO | Admitting: Hematology and Oncology

## 2022-05-06 ENCOUNTER — Ambulatory Visit
Admission: RE | Admit: 2022-05-06 | Discharge: 2022-05-06 | Disposition: A | Discharge status: Home / Self Care | Payer: BC Managed Care – PPO | Source: Ambulatory Visit | Attending: Radiation Oncology | Admitting: Radiation Oncology

## 2022-05-06 ENCOUNTER — Encounter: Payer: Self-pay | Admitting: Hematology and Oncology

## 2022-05-06 VITALS — BP 128/74 | HR 59 | Temp 97.8°F | Resp 16 | Ht 68.0 in | Wt 172.8 lb

## 2022-05-06 DIAGNOSIS — Z170 Estrogen receptor positive status [ER+]: Secondary | ICD-10-CM | POA: Insufficient documentation

## 2022-05-06 DIAGNOSIS — C50212 Malignant neoplasm of upper-inner quadrant of left female breast: Secondary | ICD-10-CM

## 2022-05-06 DIAGNOSIS — Z510 Encounter for antineoplastic radiation therapy: Secondary | ICD-10-CM | POA: Diagnosis not present

## 2022-05-06 DIAGNOSIS — Z79810 Long term (current) use of selective estrogen receptor modulators (SERMs): Secondary | ICD-10-CM | POA: Insufficient documentation

## 2022-05-06 DIAGNOSIS — C50412 Malignant neoplasm of upper-outer quadrant of left female breast: Secondary | ICD-10-CM | POA: Insufficient documentation

## 2022-05-06 LAB — RAD ONC ARIA SESSION SUMMARY
Course Elapsed Days: 12
Plan Fractions Treated to Date: 9
Plan Prescribed Dose Per Fraction: 2.66 Gy
Plan Total Fractions Prescribed: 16
Plan Total Prescribed Dose: 42.56 Gy
Reference Point Dosage Given to Date: 23.94 Gy
Reference Point Session Dosage Given: 2.66 Gy
Session Number: 9

## 2022-05-06 MED ORDER — TAMOXIFEN CITRATE 20 MG PO TABS: 20.0000 mg | ORAL_TABLET | Freq: Every day | ORAL | 3 refills | Status: DC

## 2022-05-06 NOTE — Progress Notes (Signed)
[facility] CONSULT NOTE  Patient Care Team: Sigmund Hazel, MD as PCP - General (Family Medicine) Althea Charon, MD (Radiology) Ardell Isaacs, Forrestine Him, MD as Consulting Physician (Obstetrics and Gynecology) Pershing Proud, RN as Oncology Nurse Navigator Donnelly Angelica, RN as Oncology Nurse Navigator Rachel Moulds, MD as Consulting Physician (Hematology and Oncology) Dorothy Puffer, MD as Consulting Physician (Radiation Oncology) Manus Rudd, MD as Consulting Physician (General Surgery)  CHIEF COMPLAINTS/PURPOSE OF CONSULTATION:  Breast cancer follow up  HISTORY OF PRESENTING ILLNESS:  Chelsea [name] 57 y.o. female is here because of recent diagnosis of left breast cancer  I reviewed her records extensively and collaborated the history with the patient.  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  Ultrasound of the breast confirmed 5 x 5 x 4 mm irregular mass in right breast at 3:00 middle depth suspicious for malignancy.  Biopsy recommended.  6 x 3 x 5 mm irregular mass in the right breast at 1:00 anterior depth suspicious of malignancy, biopsy recommended.  8 x 6 x 3 mm irregular mass in the left breast at 1:00 middle depth suspicious of malignancy.  Biopsy recommended   02/10/2022 Pathology Results   Left breast needle core biopsy at 1:00 5 cm from the nipple shows invasive mammary carcinoma, overall grade 2.  Right breast needle core biopsy at 3:00 3 cm from the nipple shows CSL.  Right breast needle core biopsy 1:00 2 cm from nipple shows benign breast parenchyma with PASH, no malignancy identified.  Prognostics from the left breast biopsy showed ER 80% positive strong staining PR 50% positive strong staining Ki-67 of 40% and HER2  negative by IHC.   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  Left breast lumpectomy showed 2.1 cm grade 2 IDC, DCIS, negative margins, no lymphovascular invasion.  Prior prognostics ER +80% PR +50% and HER2 negative Oncotype 24, no additional benefit from chemo. She is now here to discuss about antiestrogen therapy.  She tells me that she is really worried about the adverse effects of tamoxifen and was discussing with some friends, and support groups and on social media and is worried about her quality of life.  She also happened to have a conversation with her cousin's husband who is an Best boy as well.  MEDICAL HISTORY:  Past Medical History:  Diagnosis Date   Allergy    Anemia    Depression 2004   History - situational, no meds   Encounter for insertion of mirena IUD 08/04/2008   Enlarged thyroid    being monitored yearly   History of COVID-19 02/2019   Hx of adenomatous polyp of colon 01/23/2016   Hypertriglyceridemia    Multiple thyroid nodules    being monitored yearly   Seasonal allergies    Seasonal allergies  SVD (spontaneous vaginal delivery)    x 2    SURGICAL HISTORY: Past Surgical History:  Procedure Laterality Date   APPENDECTOMY  1988   BREAST LUMPECTOMY WITH RADIOACTIVE SEED AND SENTINEL LYMPH NODE BIOPSY Left 03/05/2022   Procedure: LEFT BREAST LUMPECTOMY WITH RADIOACTIVE SEED AND SENTINEL LYMPH NODE BIOPSY;  Surgeon: Manus Rudd, MD;  Location: [facility];  Service: General;  Laterality: Left;  PEC BLOCK   BREAST LUMPECTOMY WITH RADIOACTIVE SEED LOCALIZATION Right 03/05/2022   Procedure: RIGHT BREAST LUMPECTOMY WITH RADIOACTIVE SEED LOCALIZATION;  Surgeon: Manus Rudd, MD;  [facility];  Service: General;  Laterality: Right;   COLONOSCOPY  04/22/2019   COLONOSCOPY  01/04/2016   COLPOSCOPY  1994   ASCUS   DILATION AND CURETTAGE OF UTERUS N/A 06/13/2014   Procedure: DILATATION AND CURETTAGE;  Surgeon: Patton Salles, MD;  Location: [facility];  Service: Gynecology;  Laterality: N/A;   HYSTEROSCOPY N/A 06/13/2014   Procedure: HYSTEROSCOPY WITH NOVASURE ABLATION and removal of malpositioned IUD ;  Surgeon: Patton Salles, MD;  Location: [facility];  Service: Gynecology;  Laterality: N/A;   LAPAROSCOPIC TUBAL LIGATION N/A 06/13/2014   Procedure: LAPAROSCOPIC BILATERAL TUBAL LIGATION WITH CAUTERY ;  Surgeon: Patton Salles, MD;  Location: [facility];  Service: Gynecology;  Laterality: N/A;   thyroid biopsy  10/11   hyplerplastic thyroid nodule- multinodular goiter   TUBAL LIGATION  06-13-14   WISDOM TOOTH EXTRACTION      SOCIAL HISTORY: Social History   Socioeconomic History   Marital status: Divorced    Spouse name: Not on file   Number of children: Not on file   Years of education: Not on file   Highest education level: Not on file  Occupational History   Not on file  Tobacco Use   Smoking status: Never   Smokeless tobacco: Never  Vaping Use   Vaping Use: Never used  Substance and Sexual Activity   Alcohol use: Yes    Alcohol/week: 2.0 - 3.0 standard drinks of alcohol    Types: 2 - 3 Glasses of wine per week   Drug use: No   Sexual activity: Yes    Partners: Male    Birth control/protection: Surgical    Comment: BTL  Other Topics Concern   Not on file  Social History Narrative   Not on file   Social Determinants of Health   Financial Resource Strain: Not on file   Food Insecurity: No Food Insecurity (02/19/2022)   Hunger Vital Sign    Worried About Running Out of Food in the Last Year: Never true    Ran Out of Food in the Last Year: Never true  Transportation Needs: No Transportation Needs (02/19/2022)   PRAPARE - Administrator, Civil Service (Medical): No    Lack of Transportation (Non-Medical): No  Physical Activity: Inactive (02/19/2022)   Exercise Vital Sign    Days of Exercise per Week: 0 days    Minutes of Exercise per Session: 0 min  Stress: Stress Concern Present (02/19/2022)   [employer] of Occupational Health - Occupational Stress Questionnaire    Feeling of Stress : Rather much  Social Connections: Not on file  Intimate Partner Violence: Not on file    FAMILY HISTORY: Family History  Problem Relation Age of Onset   Hypertension Mother    Hypertension Father    Colon polyps Father    Colon cancer  Neg Hx    Rectal cancer Neg Hx    Esophageal cancer Neg Hx    Stomach cancer Neg Hx     ALLERGIES:  is allergic to elemental sulfur, codeine, mupirocin, percocet [oxycodone-acetaminophen], and sulfa antibiotics.  MEDICATIONS:  Current Outpatient Medications  Medication Sig Dispense Refill   tamoxifen (NOLVADEX) 20 MG tablet Take 1 tablet (20 mg total) by mouth daily. 90 tablet 3   Ibuprofen 200 MG CAPS Take by mouth.     methocarbamol (ROBAXIN) 500 MG tablet Take 1 tablet by mouth as needed. c     naproxen (NAPROSYN) 500 MG tablet Take 500 mg by mouth 2 (two) times daily as needed. Reported on 02/26/2015  2   traMADol (ULTRAM) 50 MG tablet Take 1 tablet (50 mg total) by mouth every 6 (six) hours as needed for moderate pain or severe pain. 20 tablet 0   No current facility-administered medications for this visit.    REVIEW OF SYSTEMS:   Constitutional: Denies fevers, chills or abnormal night sweats Eyes: Denies blurriness of vision, double vision or watery eyes Ears, nose, mouth, throat, and face: Denies  mucositis or sore throat Respiratory: Denies cough, dyspnea or wheezes Cardiovascular: Denies palpitation, chest discomfort or lower extremity swelling Gastrointestinal:  Denies nausea, heartburn or change in bowel habits Skin: Denies abnormal skin rashes Lymphatics: Denies new lymphadenopathy or easy bruising Neurological:Denies numbness, tingling or new weaknesses Behavioral/Psych: Mood is stable, no new changes  Breast: Denies any palpable lumps or discharge All other systems were reviewed with the patient and are negative.  PHYSICAL EXAMINATION: ECOG PERFORMANCE STATUS: 0 - Asymptomatic  Vitals:   05/06/22 1541  BP: 128/74  Pulse: (!) 59  Resp: 16  Temp: 97.8 F (36.6 C)  SpO2: 100%    Filed Weights   05/06/22 1541  Weight: 172 lb 12.8 oz (78.4 kg)   GENERAL:alert, no distress and comfortable Rest of the PE deferred in lieu of counseling  LABORATORY DATA:  I have reviewed the data as listed Lab Results  Component Value Date   WBC 5.1 02/19/2022   HGB 14.4 02/19/2022   HCT 41.5 02/19/2022   MCV 94.5 02/19/2022   PLT 311 02/19/2022   Lab Results  Component Value Date   NA 140 02/19/2022   K 4.1 02/19/2022   CL 104 02/19/2022   CO2 29 02/19/2022    RADIOGRAPHIC STUDIES: I have personally reviewed the radiological reports and agreed with the findings in the report.  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  Biopsy from the 2 right-sided breast masses suggestive of CSL and patch with no evidence of malignancy.  Given tumor measuring more than 5 mm, we have discussed about Oncotype testing.  I have discussed the following details about Oncotype. She is now status post lumpectomy, final  pathology from the left breast showed 2.1 cm grade 2 IDC, ER/PR positive HER2 negative, negative margins.  Oncotype DX of 24, no significant benefit from chemotherapy.  She is now getting ready to complete adjuvant radiation and is here to discuss antiestrogen therapy.  We have discussed about options for antiestrogen therapy however she is premenopausal, last menstrual cycle in December hence we have discussed that her best option would be tamoxifen.  She was very  worried about the adverse effects especially reading on support groups and social media and is very concerned, tearful about her quality of life on tamoxifen hence had excellent questions for me today.  I have discussed that we can add ovarian suppression and try aromatase inhibitors but there is no additional benefit from this approach especially since she did not need adjuvant chemotherapy based on soft and text data.  I have also mentioned that ovarian suppression can also cause similar adverse effects with postmenopausal symptoms although there may not be a significant risk of DVT/PE with this combination. We have talked about labs to look at menopause however if she had a menstrual cycle in December, I do not believe these will be of value even if they come back as postmenopause.  She is agreeable to all the recommendations.  She will start tamoxifen and return to clinic in about 3 3-1/2 months for toxicity check.  She was encouraged to call us sooner with any new questions or concerns.  Total time spent: 30 minutes including history, physical exam, review of records, counseling and coordination of care All questions were answered. The patient knows to call the clinic with any problems, questions or concerns.    Rachel Moulds, MD 05/07/22

## 2022-05-07 ENCOUNTER — Ambulatory Visit
Admission: RE | Admit: 2022-05-07 | Discharge: 2022-05-07 | Disposition: A | Discharge status: Home / Self Care | Payer: BC Managed Care – PPO | Source: Ambulatory Visit | Attending: Radiation Oncology | Admitting: Radiation Oncology

## 2022-05-07 ENCOUNTER — Telehealth: Payer: Self-pay | Admitting: Hematology and Oncology

## 2022-05-07 ENCOUNTER — Other Ambulatory Visit: Payer: Self-pay

## 2022-05-07 DIAGNOSIS — Z510 Encounter for antineoplastic radiation therapy: Secondary | ICD-10-CM | POA: Diagnosis not present

## 2022-05-07 LAB — RAD ONC ARIA SESSION SUMMARY
Course Elapsed Days: 13
Plan Fractions Treated to Date: 10
Plan Prescribed Dose Per Fraction: 2.66 Gy
Plan Total Fractions Prescribed: 16
Plan Total Prescribed Dose: 42.56 Gy
Reference Point Dosage Given to Date: 26.6 Gy
Reference Point Session Dosage Given: 2.66 Gy
Session Number: 10

## 2022-05-07 NOTE — Telephone Encounter (Signed)
Left patient a vm regarding upcoming appointment  

## 2022-05-07 NOTE — Assessment & Plan Note (Signed)
This is a very pleasant 58 year old female patient with newly diagnosed left breast invasive ductal carcinoma, overall grade 2, ER/PR positive, HER2 negative, Ki-67 of 40% referred to breast [facility] for recommendations.  Biopsy from the 2 right-sided breast masses suggestive of CSL and patch with no evidence of malignancy.  Given tumor measuring more than 5 mm, we have discussed about Oncotype testing.  I have discussed the following details about Oncotype. She is now status post lumpectomy, final pathology from the left breast showed 2.1 cm grade 2 IDC, ER/PR positive HER2 negative, negative margins.  Oncotype DX of 24, no significant benefit from chemotherapy.  She is now getting ready to complete adjuvant radiation and is here to discuss antiestrogen therapy.  We have discussed about options for antiestrogen therapy however she is premenopausal, last menstrual cycle in December hence we have discussed that her best option would be tamoxifen.  She was very worried about the adverse effects especially reading on support groups and social media and is very concerned, tearful about her quality of life on tamoxifen hence had excellent questions for me today.  I have discussed that we can add ovarian suppression and try aromatase inhibitors but there is no additional benefit from this approach especially since she did not need adjuvant chemotherapy based on soft and text data.  I have also mentioned that ovarian suppression can also cause similar adverse effects with postmenopausal symptoms although there may not be a significant risk of DVT/PE with this combination. We have talked about labs to look at menopause however if she had a menstrual cycle in December, I do not believe these will be of value even if they come back as postmenopause.  She is agreeable to all the recommendations.  She will start tamoxifen and return to clinic in about 3 3-1/2 months for toxicity check.  She was encouraged to call us sooner  with any new questions or concerns.

## 2022-05-08 ENCOUNTER — Ambulatory Visit
Admission: RE | Admit: 2022-05-08 | Discharge: 2022-05-08 | Disposition: A | Discharge status: Home / Self Care | Payer: BC Managed Care – PPO | Source: Ambulatory Visit | Attending: Radiation Oncology | Admitting: Radiation Oncology

## 2022-05-08 ENCOUNTER — Other Ambulatory Visit: Payer: Self-pay

## 2022-05-08 DIAGNOSIS — Z510 Encounter for antineoplastic radiation therapy: Secondary | ICD-10-CM | POA: Diagnosis not present

## 2022-05-08 LAB — RAD ONC ARIA SESSION SUMMARY
Course Elapsed Days: 14
Plan Fractions Treated to Date: 11
Plan Prescribed Dose Per Fraction: 2.66 Gy
Plan Total Fractions Prescribed: 16
Plan Total Prescribed Dose: 42.56 Gy
Reference Point Dosage Given to Date: 29.26 Gy
Reference Point Session Dosage Given: 2.66 Gy
Session Number: 11

## 2022-05-09 ENCOUNTER — Ambulatory Visit
Admission: RE | Admit: 2022-05-09 | Discharge: 2022-05-09 | Disposition: A | Discharge status: Home / Self Care | Payer: BC Managed Care – PPO | Source: Ambulatory Visit | Attending: Radiation Oncology | Admitting: Radiation Oncology

## 2022-05-09 ENCOUNTER — Ambulatory Visit: Payer: BC Managed Care – PPO | Admitting: Radiation Oncology

## 2022-05-09 ENCOUNTER — Other Ambulatory Visit: Payer: Self-pay

## 2022-05-09 DIAGNOSIS — Z510 Encounter for antineoplastic radiation therapy: Secondary | ICD-10-CM | POA: Diagnosis not present

## 2022-05-09 LAB — RAD ONC ARIA SESSION SUMMARY
Course Elapsed Days: 15
Plan Fractions Treated to Date: 12
Plan Prescribed Dose Per Fraction: 2.66 Gy
Plan Total Fractions Prescribed: 16
Plan Total Prescribed Dose: 42.56 Gy
Reference Point Dosage Given to Date: 31.92 Gy
Reference Point Session Dosage Given: 2.66 Gy
Session Number: 12

## 2022-05-12 ENCOUNTER — Other Ambulatory Visit: Payer: Self-pay

## 2022-05-12 ENCOUNTER — Ambulatory Visit
Admission: RE | Admit: 2022-05-12 | Discharge: 2022-05-12 | Disposition: A | Discharge status: Home / Self Care | Payer: BC Managed Care – PPO | Source: Ambulatory Visit | Attending: Radiation Oncology | Admitting: Radiation Oncology

## 2022-05-12 DIAGNOSIS — Z510 Encounter for antineoplastic radiation therapy: Secondary | ICD-10-CM | POA: Diagnosis not present

## 2022-05-12 LAB — RAD ONC ARIA SESSION SUMMARY
Course Elapsed Days: 18
Plan Fractions Treated to Date: 13
Plan Prescribed Dose Per Fraction: 2.66 Gy
Plan Total Fractions Prescribed: 16
Plan Total Prescribed Dose: 42.56 Gy
Reference Point Dosage Given to Date: 34.58 Gy
Reference Point Session Dosage Given: 2.66 Gy
Session Number: 13

## 2022-05-13 ENCOUNTER — Ambulatory Visit
Admission: RE | Admit: 2022-05-13 | Discharge: 2022-05-13 | Disposition: A | Discharge status: Home / Self Care | Payer: BC Managed Care – PPO | Source: Ambulatory Visit | Attending: Radiation Oncology | Admitting: Radiation Oncology

## 2022-05-13 ENCOUNTER — Other Ambulatory Visit: Payer: Self-pay

## 2022-05-13 DIAGNOSIS — Z510 Encounter for antineoplastic radiation therapy: Secondary | ICD-10-CM | POA: Diagnosis not present

## 2022-05-13 LAB — RAD ONC ARIA SESSION SUMMARY
Course Elapsed Days: 19
Plan Fractions Treated to Date: 14
Plan Prescribed Dose Per Fraction: 2.66 Gy
Plan Total Fractions Prescribed: 16
Plan Total Prescribed Dose: 42.56 Gy
Reference Point Dosage Given to Date: 37.24 Gy
Reference Point Session Dosage Given: 2.66 Gy
Session Number: 14

## 2022-05-14 ENCOUNTER — Other Ambulatory Visit: Payer: Self-pay

## 2022-05-14 ENCOUNTER — Ambulatory Visit
Admission: RE | Admit: 2022-05-14 | Discharge: 2022-05-14 | Disposition: A | Discharge status: Home / Self Care | Payer: BC Managed Care – PPO | Source: Ambulatory Visit | Attending: Radiation Oncology | Admitting: Radiation Oncology

## 2022-05-14 DIAGNOSIS — Z510 Encounter for antineoplastic radiation therapy: Secondary | ICD-10-CM | POA: Diagnosis not present

## 2022-05-14 LAB — RAD ONC ARIA SESSION SUMMARY
Course Elapsed Days: 20
Plan Fractions Treated to Date: 15
Plan Prescribed Dose Per Fraction: 2.66 Gy
Plan Total Fractions Prescribed: 16
Plan Total Prescribed Dose: 42.56 Gy
Reference Point Dosage Given to Date: 39.9 Gy
Reference Point Session Dosage Given: 2.66 Gy
Session Number: 15

## 2022-05-15 ENCOUNTER — Ambulatory Visit
Admission: RE | Admit: 2022-05-15 | Discharge: 2022-05-15 | Disposition: A | Discharge status: Home / Self Care | Payer: BC Managed Care – PPO | Source: Ambulatory Visit | Attending: Radiation Oncology | Admitting: Radiation Oncology

## 2022-05-15 ENCOUNTER — Other Ambulatory Visit: Payer: Self-pay

## 2022-05-15 DIAGNOSIS — Z510 Encounter for antineoplastic radiation therapy: Secondary | ICD-10-CM | POA: Diagnosis not present

## 2022-05-15 LAB — RAD ONC ARIA SESSION SUMMARY
Course Elapsed Days: 21
Plan Fractions Treated to Date: 16
Plan Prescribed Dose Per Fraction: 2.66 Gy
Plan Total Fractions Prescribed: 16
Plan Total Prescribed Dose: 42.56 Gy
Reference Point Dosage Given to Date: 42.56 Gy
Reference Point Session Dosage Given: 2.66 Gy
Session Number: 16

## 2022-05-16 ENCOUNTER — Other Ambulatory Visit: Payer: Self-pay

## 2022-05-16 ENCOUNTER — Ambulatory Visit
Admission: RE | Admit: 2022-05-16 | Discharge: 2022-05-16 | Disposition: A | Discharge status: Home / Self Care | Payer: BC Managed Care – PPO | Source: Ambulatory Visit | Attending: Radiation Oncology | Admitting: Radiation Oncology

## 2022-05-16 ENCOUNTER — Ambulatory Visit: Payer: BC Managed Care – PPO

## 2022-05-16 DIAGNOSIS — Z510 Encounter for antineoplastic radiation therapy: Secondary | ICD-10-CM | POA: Diagnosis not present

## 2022-05-16 LAB — RAD ONC ARIA SESSION SUMMARY
Course Elapsed Days: 22
Plan Fractions Treated to Date: 1
Plan Prescribed Dose Per Fraction: 2 Gy
Plan Total Fractions Prescribed: 4
Plan Total Prescribed Dose: 8 Gy
Reference Point Dosage Given to Date: 2 Gy
Reference Point Session Dosage Given: 2 Gy
Session Number: 17

## 2022-05-19 ENCOUNTER — Ambulatory Visit
Admission: RE | Admit: 2022-05-19 | Discharge: 2022-05-19 | Disposition: A | Discharge status: Home / Self Care | Payer: BC Managed Care – PPO | Source: Ambulatory Visit | Attending: Radiation Oncology | Admitting: Radiation Oncology

## 2022-05-19 ENCOUNTER — Other Ambulatory Visit: Payer: Self-pay

## 2022-05-19 DIAGNOSIS — Z510 Encounter for antineoplastic radiation therapy: Secondary | ICD-10-CM | POA: Diagnosis not present

## 2022-05-19 LAB — RAD ONC ARIA SESSION SUMMARY
Course Elapsed Days: 25
Plan Fractions Treated to Date: 2
Plan Prescribed Dose Per Fraction: 2 Gy
Plan Total Fractions Prescribed: 4
Plan Total Prescribed Dose: 8 Gy
Reference Point Dosage Given to Date: 4 Gy
Reference Point Session Dosage Given: 2 Gy
Session Number: 18

## 2022-05-20 ENCOUNTER — Ambulatory Visit
Admission: RE | Admit: 2022-05-20 | Discharge: 2022-05-20 | Disposition: A | Discharge status: Home / Self Care | Payer: BC Managed Care – PPO | Source: Ambulatory Visit | Attending: Radiation Oncology | Admitting: Radiation Oncology

## 2022-05-20 ENCOUNTER — Other Ambulatory Visit: Payer: Self-pay

## 2022-05-20 DIAGNOSIS — Z510 Encounter for antineoplastic radiation therapy: Secondary | ICD-10-CM | POA: Diagnosis not present

## 2022-05-20 LAB — RAD ONC ARIA SESSION SUMMARY
Course Elapsed Days: 26
Plan Fractions Treated to Date: 3
Plan Prescribed Dose Per Fraction: 2 Gy
Plan Total Fractions Prescribed: 4
Plan Total Prescribed Dose: 8 Gy
Reference Point Dosage Given to Date: 6 Gy
Reference Point Session Dosage Given: 2 Gy
Session Number: 19

## 2022-05-21 ENCOUNTER — Encounter: Payer: Self-pay | Admitting: *Deleted

## 2022-05-21 ENCOUNTER — Ambulatory Visit
Admission: RE | Admit: 2022-05-21 | Discharge: 2022-05-21 | Disposition: A | Discharge status: Home / Self Care | Payer: BC Managed Care – PPO | Source: Ambulatory Visit | Attending: Radiation Oncology | Admitting: Radiation Oncology

## 2022-05-21 ENCOUNTER — Other Ambulatory Visit: Payer: Self-pay

## 2022-05-21 DIAGNOSIS — C50212 Malignant neoplasm of upper-inner quadrant of left female breast: Secondary | ICD-10-CM

## 2022-05-21 DIAGNOSIS — Z510 Encounter for antineoplastic radiation therapy: Secondary | ICD-10-CM | POA: Diagnosis not present

## 2022-05-21 LAB — RAD ONC ARIA SESSION SUMMARY
Course Elapsed Days: 27
Plan Fractions Treated to Date: 4
Plan Prescribed Dose Per Fraction: 2 Gy
Plan Total Fractions Prescribed: 4
Plan Total Prescribed Dose: 8 Gy
Reference Point Dosage Given to Date: 8 Gy
Reference Point Session Dosage Given: 2 Gy
Session Number: 20

## 2022-05-23 NOTE — Radiation Completion Notes (Signed)
  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   Intent: Curative     ==========DELIVERED PLANS==========  First Treatment Date: 2022-04-24 - Last Treatment Date: 2022-05-21   Plan Name: Breast_L_BH Site: Breast, Left Technique: 3D Mode: Photon Dose Per Fraction: 2.66 Gy Prescribed Dose (Delivered / Prescribed): 42.56 Gy / 42.56 Gy Prescribed Fxs (Delivered / Prescribed): 16 / 16   Plan Name: Brst_L_BH_Bst Site: Breast, Left Technique: 3D Mode: Photon Dose Per Fraction: 2 Gy Prescribed Dose (Delivered / Prescribed): 8 Gy / 8 Gy Prescribed Fxs (Delivered / Prescribed): 4 / 4     ==========ON TREATMENT VISIT DATES========== 2022-04-25, 2022-05-02, 2022-05-09, 2022-05-15   See weekly On Treatment Notes is Epic for details. The patient tolerated radiation. She developed anticipated skin changes in the treatment field.   The patient will receive a call in about one month from the radiation oncology department. She will continue follow up with Dr. Al Pimple as well.      Osker Mason, PAC

## 2022-06-09 ENCOUNTER — Ambulatory Visit: Payer: BC Managed Care – PPO | Attending: Surgery

## 2022-06-09 VITALS — Wt 166.4 lb

## 2022-06-09 DIAGNOSIS — Z483 Aftercare following surgery for neoplasm: Secondary | ICD-10-CM | POA: Insufficient documentation

## 2022-06-09 NOTE — Therapy (Signed)
OUTPATIENT PHYSICAL THERAPY SOZO SCREENING NOTE   Patient Name: Chelsea [name] MRN: [id number] DOB:01-25-1966, 57 y.o., female Today's Date: 06/09/2022  PCP: Sigmund Hazel, MD REFERRING PROVIDER: Manus Rudd, MD   PT End of Session - 06/09/22 1508     Visit Number 2   # unchanged due to screen only   PT Start Time 1506    PT Stop Time 1513    PT Time Calculation (min) 7 min    Activity Tolerance Patient tolerated treatment well    Behavior During Therapy [facility] for tasks assessed/performed             Past Medical History:  Diagnosis Date   Allergy    Anemia    Depression 2004   History - situational, no meds   Encounter for insertion of mirena IUD 08/04/2008   Enlarged thyroid    being monitored yearly   History of COVID-19 02/2019   Hx of adenomatous polyp of colon 01/23/2016   Hypertriglyceridemia    Multiple thyroid nodules    being monitored yearly   Seasonal allergies    Seasonal allergies    SVD (spontaneous vaginal delivery)    x 2   Past Surgical History:  Procedure Laterality Date   APPENDECTOMY  1988   BREAST LUMPECTOMY WITH RADIOACTIVE SEED AND SENTINEL LYMPH NODE BIOPSY Left 03/05/2022   Procedure: LEFT BREAST LUMPECTOMY WITH RADIOACTIVE SEED AND SENTINEL LYMPH NODE BIOPSY;  Surgeon: Manus Rudd, MD;  Location: [facility];  Service: General;  Laterality: Left;  PEC BLOCK   BREAST LUMPECTOMY WITH RADIOACTIVE SEED LOCALIZATION Right 03/05/2022   Procedure: RIGHT BREAST LUMPECTOMY WITH RADIOACTIVE SEED LOCALIZATION;  Surgeon: Manus Rudd, MD;  Location: [facility];  Service: General;  Laterality: Right;   COLONOSCOPY  04/22/2019   COLONOSCOPY  01/04/2016   COLPOSCOPY  1994   ASCUS   DILATION AND CURETTAGE OF UTERUS N/A 06/13/2014   Procedure: DILATATION AND CURETTAGE;  Surgeon: Patton Salles, MD;  Location: [facility];  Service: Gynecology;  Laterality: N/A;   HYSTEROSCOPY N/A 06/13/2014   Procedure: HYSTEROSCOPY  WITH NOVASURE ABLATION and removal of malpositioned IUD ;  Surgeon: Patton Salles, MD;  Location: [facility];  Service: Gynecology;  Laterality: N/A;   LAPAROSCOPIC TUBAL LIGATION N/A 06/13/2014   Procedure: LAPAROSCOPIC BILATERAL TUBAL LIGATION WITH CAUTERY ;  Surgeon: Patton Salles, MD;  Location: [facility];  Service: Gynecology;  Laterality: N/A;   thyroid biopsy  10/11   hyplerplastic thyroid nodule- multinodular goiter   TUBAL LIGATION  06-13-14   WISDOM TOOTH EXTRACTION     Patient Active Problem List   Diagnosis Date Noted   Primary malignant neoplasm of upper inner quadrant of female breast, left (HCC) 02/17/2022   Hx of adenomatous polyp of colon 01/23/2016    REFERRING DIAG: left breast cancer at risk for lymphedema  THERAPY DIAG:  Aftercare following surgery for neoplasm  PERTINENT HISTORY: Patient was diagnosed on 01/24/2022 with 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 It measures 0.8 cm and is located in the upper outer quadrant. It is ER/PR positive and HER2 negative with a Ki67 of 40%. She has a complex sclerosing lesion on the right side.  bil lumpectomy on 03/05/22 with left SLNB with 1 negative node removed.   PRECAUTIONS: left UE Lymphedema risk, None  SUBJECTIVE: Pt returns for her 3 month L-Dex screen.   PAIN:  Are you having pain? No  SOZO SCREENING: Patient was assessed today using the SOZO machine to determine the lymphedema index score. This was compared to her baseline score. It was determined that she is within the recommended range when compared to her baseline and no further action is needed at this time. She will continue SOZO screenings. These are done every 3 months for 2 years post operatively followed by every 6 months for 2 years, and then annually. Encouraged pt to resume wearing her compression bra 24/7 as able for next few months as she has just finished radiation.    L-DEX FLOWSHEETS - 06/09/22 1500       L-DEX  LYMPHEDEMA SCREENING   Measurement Type Unilateral    L-DEX MEASUREMENT EXTREMITY Upper Extremity    POSITION  Standing    DOMINANT SIDE Right    At Risk Side Left    BASELINE SCORE (UNILATERAL) 0.7    L-DEX SCORE (UNILATERAL) 3.2    VALUE CHANGE (UNILAT) 2.5               Hermenia Bers, PTA 06/09/2022, 3:15 PM

## 2022-06-11 ENCOUNTER — Ambulatory Visit: Payer: Self-pay | Admitting: Rehabilitation

## 2022-07-21 ENCOUNTER — Ambulatory Visit
Admission: RE | Admit: 2022-07-21 | Discharge: 2022-07-21 | Disposition: A | Discharge status: Home / Self Care | Payer: BC Managed Care – PPO | Source: Ambulatory Visit | Attending: Radiation Oncology | Admitting: Radiation Oncology

## 2022-07-21 NOTE — Progress Notes (Signed)
  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   Symptoms of fatigue have improved since completing therapy.  Symptoms of skin changes have improved since completing therapy.  The patient was encouraged to avoid sun exposure in the area of prior treatment for up to one year following radiation with either sunscreen or by the style of clothing worn in the sun.  The patient has scheduled follow up with her medical oncologist Dr. Al Pimple for ongoing surveillance, and was encouraged to call if she develops concerns or questions regarding radiation.  This concludes the interaction.  Ruel Favors, LPN

## 2022-08-04 ENCOUNTER — Telehealth: Payer: Self-pay | Admitting: Hematology and Oncology

## 2022-09-01 ENCOUNTER — Ambulatory Visit: Payer: BC Managed Care – PPO | Attending: Surgery

## 2022-09-01 VITALS — Wt 155.4 lb

## 2022-09-01 DIAGNOSIS — Z483 Aftercare following surgery for neoplasm: Secondary | ICD-10-CM | POA: Insufficient documentation

## 2022-09-01 NOTE — Therapy (Signed)
OUTPATIENT PHYSICAL THERAPY SOZO SCREENING NOTE   Patient Name: Chelsea [name] MRN: [id number] DOB:03/10/1965, 57 y.o., female Today's Date: 09/01/2022  PCP: Sigmund Hazel, MD REFERRING PROVIDER: Manus Rudd, MD   PT End of Session - 09/01/22 1508     Visit Number 2   # unchanged due to screen only   PT Start Time 1507    PT Stop Time 1511    PT Time Calculation (min) 4 min    Activity Tolerance Patient tolerated treatment well    Behavior During Therapy [facility] for tasks assessed/performed             Past Medical History:  Diagnosis Date   Allergy    Anemia    Depression 2004   History - situational, no meds   Encounter for insertion of mirena IUD 08/04/2008   Enlarged thyroid    being monitored yearly   History of COVID-19 02/2019   Hx of adenomatous polyp of colon 01/23/2016   Hypertriglyceridemia    Multiple thyroid nodules    being monitored yearly   Seasonal allergies    Seasonal allergies    SVD (spontaneous vaginal delivery)    x 2   Past Surgical History:  Procedure Laterality Date   APPENDECTOMY  1988   BREAST LUMPECTOMY WITH RADIOACTIVE SEED AND SENTINEL LYMPH NODE BIOPSY Left 03/05/2022   Procedure: LEFT BREAST LUMPECTOMY WITH RADIOACTIVE SEED AND SENTINEL LYMPH NODE BIOPSY;  Surgeon: Manus Rudd, MD;  Location: [facility];  Service: General;  Laterality: Left;  PEC BLOCK   BREAST LUMPECTOMY WITH RADIOACTIVE SEED LOCALIZATION Right 03/05/2022   Procedure: RIGHT BREAST LUMPECTOMY WITH RADIOACTIVE SEED LOCALIZATION;  Surgeon: Manus Rudd, MD;  Location: [facility];  Service: General;  Laterality: Right;   COLONOSCOPY  04/22/2019   COLONOSCOPY  01/04/2016   COLPOSCOPY  1994   ASCUS   DILATION AND CURETTAGE OF UTERUS N/A 06/13/2014   Procedure: DILATATION AND CURETTAGE;  Surgeon: Patton Salles, MD;  Location: [facility];  Service: Gynecology;  Laterality: N/A;   HYSTEROSCOPY N/A 06/13/2014   Procedure: HYSTEROSCOPY  WITH NOVASURE ABLATION and removal of malpositioned IUD ;  Surgeon: Patton Salles, MD;  Location: [facility];  Service: Gynecology;  Laterality: N/A;   LAPAROSCOPIC TUBAL LIGATION N/A 06/13/2014   Procedure: LAPAROSCOPIC BILATERAL TUBAL LIGATION WITH CAUTERY ;  Surgeon: Patton Salles, MD;  Location: [facility];  Service: Gynecology;  Laterality: N/A;   thyroid biopsy  10/11   hyplerplastic thyroid nodule- multinodular goiter   TUBAL LIGATION  06-13-14   WISDOM TOOTH EXTRACTION     Patient Active Problem List   Diagnosis Date Noted   Primary malignant neoplasm of upper inner quadrant of female breast, left (HCC) 02/17/2022   Hx of adenomatous polyp of colon 01/23/2016    REFERRING DIAG: left breast cancer at risk for lymphedema  THERAPY DIAG: Aftercare following surgery for neoplasm  PERTINENT HISTORY: Patient was diagnosed on 01/24/2022 with 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 It measures 0.8 cm and is located in the upper outer quadrant. It is ER/PR positive and HER2 negative with a Ki67 of 40%. She has a complex sclerosing lesion on the right side.  bil lumpectomy on 03/05/22 with left SLNB with 1 negative node removed.   PRECAUTIONS: left UE Lymphedema risk, None  SUBJECTIVE: Pt returns for her 3 month L-Dex screen.   PAIN:  Are you having pain? No  SOZO  SCREENING: Patient was assessed today using the SOZO machine to determine the lymphedema index score. This was compared to her baseline score. It was determined that she is within the recommended range when compared to her baseline and no further action is needed at this time. She will continue SOZO screenings. These are done every 3 months for 2 years post operatively followed by every 6 months for 2 years, and then annually. Encouraged pt to resume wearing her compression bra 24/7 as able for next few months as she has just finished radiation.    L-DEX FLOWSHEETS - 09/01/22 1500       L-DEX  LYMPHEDEMA SCREENING   Measurement Type Unilateral    L-DEX MEASUREMENT EXTREMITY Upper Extremity    POSITION  Standing    DOMINANT SIDE Right    At Risk Side Left    BASELINE SCORE (UNILATERAL) 0.7    L-DEX SCORE (UNILATERAL) 1.3    VALUE CHANGE (UNILAT) 0.6               Hermenia Bers, PTA 09/01/2022, 3:10 PM

## 2022-09-16 ENCOUNTER — Ambulatory Visit: Payer: BC Managed Care – PPO | Admitting: Hematology and Oncology

## 2022-09-26 ENCOUNTER — Inpatient Hospital Stay: Payer: BC Managed Care – PPO | Attending: Hematology and Oncology | Admitting: Hematology and Oncology

## 2022-09-26 ENCOUNTER — Encounter: Payer: Self-pay | Admitting: *Deleted

## 2022-09-26 VITALS — BP 125/77 | HR 71 | Temp 97.3°F | Resp 18 | Ht 68.0 in | Wt 153.5 lb

## 2022-09-26 DIAGNOSIS — Z79810 Long term (current) use of selective estrogen receptor modulators (SERMs): Secondary | ICD-10-CM | POA: Insufficient documentation

## 2022-09-26 DIAGNOSIS — Z923 Personal history of irradiation: Secondary | ICD-10-CM | POA: Diagnosis not present

## 2022-09-26 DIAGNOSIS — C50212 Malignant neoplasm of upper-inner quadrant of left female breast: Secondary | ICD-10-CM

## 2022-09-26 DIAGNOSIS — Z170 Estrogen receptor positive status [ER+]: Secondary | ICD-10-CM | POA: Insufficient documentation

## 2022-09-26 NOTE — Assessment & Plan Note (Signed)
This is a very pleasant 57 year old female patient with newly diagnosed left breast invasive ductal carcinoma, overall grade 2, ER/PR positive, HER2 negative, Ki-67 of 40% referred to breast [facility] for recommendations.  Biopsy from the 2 right-sided breast masses suggestive of CSL and patch with no evidence of malignancy.  Given tumor measuring more than 5 mm, we have discussed about Oncotype testing.  I have discussed the following details about Oncotype. She is now status post lumpectomy, final pathology from the left breast showed 2.1 cm grade 2 IDC, ER/PR positive HER2 negative, negative margins.  Oncotype DX of 24, no significant benefit from chemotherapy.  She then completed adjuvant radiation followed by tamoxifen.  She is tolerating tamoxifen extremely well.  She has noted some intermittent muscle cramps which she believes is from dehydration.  Other than that she has not experienced any changes.  She has also lost 25 pounds successfully so far since March and she is quite thrilled by it.  She is trying to exercise with the nerve pain comes in her way.  No concerns on physical exam, she had excellent cosmetic outcome.  No palpable masses.  No regional adenopathy.  No lower extremity edema.  At this time recommendation was to proceed with mammogram in January 2025 and return to clinic for follow-up in 6 months or sooner as needed.

## 2022-09-26 NOTE — Progress Notes (Signed)
[facility] CONSULT NOTE  Patient Care Team: Sigmund Hazel, MD as PCP - General (Family Medicine) Althea Charon, MD (Radiology) Ardell Isaacs, Forrestine Him, MD as Consulting Physician (Obstetrics and Gynecology) Pershing Proud, RN as Oncology Nurse Navigator Donnelly Angelica, RN as Oncology Nurse Navigator Rachel Moulds, MD as Consulting Physician (Hematology and Oncology) Dorothy Puffer, MD as Consulting Physician (Radiation Oncology) Manus Rudd, MD as Consulting Physician (General Surgery)  CHIEF COMPLAINTS/PURPOSE OF CONSULTATION:  Breast cancer follow up  HISTORY OF PRESENTING ILLNESS:  Chelsea [name] 57 y.o. female is here because of recent diagnosis of left breast cancer  I reviewed her records extensively and collaborated the history with the patient.  SUMMARY OF ONCOLOGIC HISTORY: Oncology History  Primary malignant neoplasm of upper inner quadrant of female breast, left (HCC)  01/24/2022 Mammogram   Mammogram from December 29 showed an oval mass in the right breast lower inner aspect posterior depth which is indeterminate.  Ultrasound of the breast confirmed 5 x 5 x 4 mm irregular mass in right breast at 3:00 middle depth suspicious for malignancy.  Biopsy recommended.  6 x 3 x 5 mm irregular mass in the right breast at 1:00 anterior depth suspicious of malignancy, biopsy recommended.  8 x 6 x 3 mm irregular mass in the left breast at 1:00 middle depth suspicious of malignancy.  Biopsy recommended   02/10/2022 Pathology Results   Left breast needle core biopsy at 1:00 5 cm from the nipple shows invasive mammary carcinoma, overall grade 2.  Right breast needle core biopsy at 3:00 3 cm from the nipple shows CSL.  Right breast needle core biopsy 1:00 2 cm from nipple shows benign breast parenchyma with PASH, no malignancy identified.  Prognostics from the left breast biopsy showed ER 80% positive strong staining PR 50% positive strong staining Ki-67 of 40%  and HER2 negative by IHC.   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  Left breast lumpectomy showed 2.1 cm grade 2 IDC, DCIS, negative margins, no lymphovascular invasion.  Prior prognostics ER +80% PR +50% and HER2 negative Oncotype 24, no additional benefit from chemo. She is now on tamoxifen for antiestrogen therapy. She has experienced some lower extremity cramps which she doesn't believe is from tamoxifen. Other than this, she didn't experience any adverse effects. She also wonders about pinched nerve in the neck since she has some radiating pain down the neck. Rest of the pertinent 10 point ROS reviewed and neg.   MEDICAL HISTORY:  Past Medical History:  Diagnosis Date   Allergy    Anemia    Depression 2004   History - situational, no meds   Encounter for insertion of mirena IUD 08/04/2008   Enlarged thyroid    being monitored yearly   History of COVID-19 02/2019   Hx of adenomatous polyp of colon 01/23/2016   Hypertriglyceridemia    Multiple thyroid nodules    being monitored yearly   Seasonal allergies    Seasonal allergies    SVD (  spontaneous vaginal delivery)    x 2    SURGICAL HISTORY: Past Surgical History:  Procedure Laterality Date   APPENDECTOMY  1988   BREAST LUMPECTOMY WITH RADIOACTIVE SEED AND SENTINEL LYMPH NODE BIOPSY Left 03/05/2022   Procedure: LEFT BREAST LUMPECTOMY WITH RADIOACTIVE SEED AND SENTINEL LYMPH NODE BIOPSY;  Surgeon: Manus Rudd, MD;  Location:  [facility];  Service: General;  Laterality: Left;  PEC BLOCK   BREAST LUMPECTOMY WITH RADIOACTIVE SEED LOCALIZATION Right 03/05/2022   Procedure: RIGHT BREAST LUMPECTOMY WITH RADIOACTIVE SEED LOCALIZATION;  Surgeon: Manus Rudd, MD;  Location: [facility];  Service: General;  Laterality: Right;   COLONOSCOPY  04/22/2019   COLONOSCOPY  01/04/2016   COLPOSCOPY  1994   ASCUS   DILATION AND CURETTAGE OF UTERUS N/A 06/13/2014   Procedure: DILATATION AND CURETTAGE;  Surgeon: Patton Salles, MD;  Location: [facility];  Service: Gynecology;  Laterality: N/A;   HYSTEROSCOPY N/A 06/13/2014   Procedure: HYSTEROSCOPY WITH NOVASURE ABLATION and removal of malpositioned IUD ;  Surgeon: Patton Salles, MD;  Location: [facility];  Service: Gynecology;  Laterality: N/A;   LAPAROSCOPIC TUBAL LIGATION N/A 06/13/2014   Procedure: LAPAROSCOPIC BILATERAL TUBAL LIGATION WITH CAUTERY ;  Surgeon: Patton Salles, MD;  Location: [facility];  Service: Gynecology;  Laterality: N/A;   thyroid biopsy  10/11   hyplerplastic thyroid nodule- multinodular goiter   TUBAL LIGATION  06-13-14   WISDOM TOOTH EXTRACTION      SOCIAL HISTORY: Social History   Socioeconomic History   Marital status: Divorced    Spouse name: Not on file   Number of children: Not on file   Years of education: Not on file   Highest education level: Not on file  Occupational History   Not on file  Tobacco Use   Smoking status: Never   Smokeless tobacco: Never  Vaping Use   Vaping status: Never Used  Substance and Sexual Activity   Alcohol use: Yes    Alcohol/week: 2.0 - 3.0 standard drinks of alcohol    Types: 2 - 3 Glasses of wine per week   Drug use: No   Sexual activity: Yes    Partners: Male    Birth control/protection: Surgical    Comment: BTL  Other Topics Concern   Not on file  Social History Narrative   Not on file   Social Determinants of Health   Financial Resource Strain: Not  on file  Food Insecurity: No Food Insecurity (02/19/2022)   Hunger Vital Sign    Worried About Running Out of Food in the Last Year: Never true    Ran Out of Food in the Last Year: Never true  Transportation Needs: No Transportation Needs (02/19/2022)   PRAPARE - Administrator, Civil Service (Medical): No    Lack of Transportation (Non-Medical): No  Physical Activity: Inactive (02/19/2022)   Exercise Vital Sign    Days of Exercise per Week: 0 days    Minutes of Exercise per Session: 0 min  Stress: Stress Concern Present (02/19/2022)   [employer] of Occupational Health - Occupational Stress Questionnaire    Feeling of Stress : Rather much  Social Connections: Not on file  Intimate Partner Violence: Not on file    FAMILY HISTORY: Family History  Problem Relation Age of Onset   Hypertension Mother    Hypertension Father    Colon polyps Father    Colon cancer Neg  Hx    Rectal cancer Neg Hx    Esophageal cancer Neg Hx    Stomach cancer Neg Hx     ALLERGIES:  is allergic to elemental sulfur, codeine, mupirocin, percocet [oxycodone-acetaminophen], and sulfa antibiotics.  MEDICATIONS:  Current Outpatient Medications  Medication Sig Dispense Refill   Ibuprofen 200 MG CAPS Take by mouth.     methocarbamol (ROBAXIN) 500 MG tablet Take 1 tablet by mouth as needed. c     naproxen (NAPROSYN) 500 MG tablet Take 500 mg by mouth 2 (two) times daily as needed. Reported on 02/26/2015  2   tamoxifen (NOLVADEX) 20 MG tablet Take 1 tablet (20 mg total) by mouth daily. 90 tablet 3   traMADol (ULTRAM) 50 MG tablet Take 1 tablet (50 mg total) by mouth every 6 (six) hours as needed for moderate pain or severe pain. 20 tablet 0   No current facility-administered medications for this visit.    REVIEW OF SYSTEMS:   Constitutional: Denies fevers, chills or abnormal night sweats Eyes: Denies blurriness of vision, double vision or watery eyes Ears, nose, mouth, throat, and face: Denies  mucositis or sore throat Respiratory: Denies cough, dyspnea or wheezes Cardiovascular: Denies palpitation, chest discomfort or lower extremity swelling Gastrointestinal:  Denies nausea, heartburn or change in bowel habits Skin: Denies abnormal skin rashes Lymphatics: Denies new lymphadenopathy or easy bruising Neurological:Denies numbness, tingling or new weaknesses Behavioral/Psych: Mood is stable, no new changes  Breast: Denies any palpable lumps or discharge All other systems were reviewed with the patient and are negative.  PHYSICAL EXAMINATION: ECOG PERFORMANCE STATUS: 0 - Asymptomatic  Vitals:   09/26/22 0851  BP: 125/77  Pulse: 71  Resp: 18  Temp: (!) 97.3 F (36.3 C)  SpO2: 99%    Filed Weights   09/26/22 0851  Weight: 153 lb 8 oz (69.6 kg)   GENERAL:alert, no distress and comfortable Breasts: Bilateral breast inspected and palpated.  No palpable masses or regional adenopathy No LE edema.  LABORATORY DATA:  I have reviewed the data as listed Lab Results  Component Value Date   WBC 5.1 02/19/2022   HGB 14.4 02/19/2022   HCT 41.5 02/19/2022   MCV 94.5 02/19/2022   PLT 311 02/19/2022   Lab Results  Component Value Date   NA 140 02/19/2022   K 4.1 02/19/2022   CL 104 02/19/2022   CO2 29 02/19/2022    RADIOGRAPHIC STUDIES: I have personally reviewed the radiological reports and agreed with the findings in the report.  ASSESSMENT AND PLAN:   Primary malignant neoplasm of upper inner quadrant of female breast, left (HCC) This is a very pleasant 57 year old female patient with newly diagnosed left breast invasive ductal carcinoma, overall grade 2, ER/PR positive, HER2 negative, Ki-67 of 40% referred to breast [facility] for recommendations.  Biopsy from the 2 right-sided breast masses suggestive of CSL and patch with no evidence of malignancy.  Given tumor measuring more than 5 mm, we have discussed about Oncotype testing.  I have discussed the following details about  Oncotype. She is now status post lumpectomy, final pathology from the left breast showed 2.1 cm grade 2 IDC, ER/PR positive HER2 negative, negative margins.  Oncotype DX of 24, no significant benefit from chemotherapy.  She then completed adjuvant radiation followed by tamoxifen.  She is tolerating tamoxifen extremely well.  She has noted some intermittent muscle cramps which she believes is from dehydration.  Other than that she has not experienced any changes.  She has  also lost 25 pounds successfully so far since March and she is quite thrilled by it.  She is trying to exercise with the nerve pain comes in her way.  No concerns on physical exam, she had excellent cosmetic outcome.  No palpable masses.  No regional adenopathy.  No lower extremity edema.  At this time recommendation was to proceed with mammogram in January 2025 and return to clinic for follow-up in 6 months or sooner as needed.   Total time spent: 30 minutes including history, physical exam, review of records, counseling and coordination of care All questions were answered. The patient knows to call the clinic with any problems, questions or concerns.    Rachel Moulds, MD 09/26/22

## 2022-10-29 ENCOUNTER — Encounter: Payer: Self-pay | Admitting: Adult Health

## 2022-10-29 ENCOUNTER — Inpatient Hospital Stay: Payer: BC Managed Care – PPO

## 2022-10-29 ENCOUNTER — Inpatient Hospital Stay: Payer: BC Managed Care – PPO | Attending: Hematology and Oncology | Admitting: Adult Health

## 2022-10-29 VITALS — BP 116/66 | HR 69 | Temp 98.0°F | Resp 18 | Ht 68.0 in | Wt 151.4 lb

## 2022-10-29 DIAGNOSIS — E041 Nontoxic single thyroid nodule: Secondary | ICD-10-CM | POA: Diagnosis not present

## 2022-10-29 DIAGNOSIS — R252 Cramp and spasm: Secondary | ICD-10-CM | POA: Insufficient documentation

## 2022-10-29 DIAGNOSIS — Z79810 Long term (current) use of selective estrogen receptor modulators (SERMs): Secondary | ICD-10-CM | POA: Insufficient documentation

## 2022-10-29 DIAGNOSIS — Z170 Estrogen receptor positive status [ER+]: Secondary | ICD-10-CM | POA: Insufficient documentation

## 2022-10-29 DIAGNOSIS — C50212 Malignant neoplasm of upper-inner quadrant of left female breast: Secondary | ICD-10-CM | POA: Insufficient documentation

## 2022-10-29 LAB — TSH: TSH: 1.522 u[IU]/mL (ref 0.350–4.500)

## 2022-10-29 LAB — T4, FREE: Free T4: 0.95 ng/dL (ref 0.61–1.12)

## 2022-10-29 NOTE — Progress Notes (Signed)
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  Ultrasound of the breast confirmed 5 x 5 x 4 mm irregular mass in right breast at 3:00 middle depth suspicious for malignancy.  Biopsy recommended.  6 x 3 x 5 mm irregular mass in the right breast at 1:00 anterior depth suspicious of malignancy, biopsy recommended.  8 x 6 x 3 mm irregular mass in the left breast at 1:00 middle depth suspicious of malignancy.  Biopsy recommended   02/10/2022 Pathology Results   Left breast needle core biopsy at 1:00 5 cm from the nipple shows invasive mammary carcinoma, overall grade 2.  Right breast needle core biopsy at 3:00 3 cm from the nipple shows CSL.  Right breast needle core biopsy 1:00 2 cm from nipple shows benign breast parenchyma with PASH, no malignancy identified.  Prognostics from the left breast biopsy showed ER 80% positive strong staining PR 50% positive strong staining Ki-67 of 40% and HER2 negative by IHC.   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, left (HCC)   02/17/2022 Cancer Staging   Staging form: Breast, AJCC 8th Edition - Clinical stage from 02/17/2022: Stage IA (cT1b, cN0, cM0, G2, ER+, PR+, HER2-) - Signed by Ronny Bacon, PA-C on 02/17/2022 Stage prefix: Initial diagnosis Method of lymph node assessment: Clinical Histologic grading system: 3 grade system   04/24/2022 - 05/21/2022 Radiation Therapy   Plan Name: Breast_L_BH Site: Breast, Left Technique: 3D Mode: Photon Dose Per Fraction: 2.66 Gy Prescribed Dose (Delivered / Prescribed): 42.56 Gy / 42.56 Gy Prescribed Fxs (Delivered / Prescribed): 16 / 16   Plan Name: Brst_L_BH_Bst Site: Breast, Left Technique: 3D Mode:  Photon Dose Per Fraction: 2 Gy Prescribed Dose (Delivered / Prescribed): 8 Gy / 8 Gy Prescribed Fxs (Delivered / Prescribed): 4 / 4   04/2022 -  Anti-estrogen oral therapy   Anastrozole     INTERVAL HISTORY:  Chelsea [name] to review her survivorship care plan detailing her treatment course for breast cancer, as well as monitoring long-term side effects of that treatment, education regarding health maintenance, screening, and overall wellness and health promotion.     Overall, Chelsea [name] reports feeling quite well.  She is taking tamoxifen daily with good tolerance.  She experiences mild hot flashes and has some leg cramping.  This leg cramping is particularly problematic.  She tells me she is drinking enough water and wonders if she should take magnesium.  She denies any unilateral swelling of her legs.  REVIEW OF SYSTEMS:  Review of Systems  Constitutional:  Negative for appetite change, chills, fatigue, fever and unexpected weight change.  HENT:   Negative for hearing loss, lump/mass and trouble swallowing.   Eyes:  Negative for eye problems and icterus.  Respiratory:  Negative for chest tightness, cough and shortness of breath.   Cardiovascular:  Negative for chest pain, leg swelling and palpitations.  Gastrointestinal:  Negative for abdominal distention, abdominal pain, constipation, diarrhea, nausea and vomiting.  Endocrine: Positive for hot flashes.  Genitourinary:  Negative for difficulty urinating.   Musculoskeletal:  Negative for arthralgias.  Skin:  Negative for itching and rash.  Neurological:  Negative for dizziness, extremity weakness, headaches and numbness.  Hematological:  Negative for adenopathy. Does  not bruise/bleed easily.  Psychiatric/Behavioral:  Negative for depression. The patient is not nervous/anxious.    Breast: Denies any new nodularity, masses, tenderness, nipple changes, or nipple discharge.       PAST MEDICAL/SURGICAL HISTORY:  Past Medical History:   Diagnosis Date   Allergy    Anemia    Depression 2004   History - situational, no meds   Encounter for insertion of Mirena IUD 08/04/2008   Enlarged thyroid    being monitored yearly   History of COVID-19 02/2019   Hx of adenomatous polyp of colon 01/23/2016   Hypertriglyceridemia    Multiple thyroid nodules    being monitored yearly   Seasonal allergies    Seasonal allergies    SVD (spontaneous vaginal delivery)    x 2   Past Surgical History:  Procedure Laterality Date   APPENDECTOMY  1988   BREAST LUMPECTOMY WITH RADIOACTIVE SEED AND SENTINEL LYMPH NODE BIOPSY Left 03/05/2022   Procedure: LEFT BREAST LUMPECTOMY WITH RADIOACTIVE SEED AND SENTINEL LYMPH NODE BIOPSY;  Surgeon: Manus Rudd, MD;  Location: [facility];  Service: General;  Laterality: Left;  PEC BLOCK   BREAST LUMPECTOMY WITH RADIOACTIVE SEED LOCALIZATION Right 03/05/2022   Procedure: RIGHT BREAST LUMPECTOMY WITH RADIOACTIVE SEED LOCALIZATION;  Surgeon: Manus Rudd, MD;  Location: [facility];  Service: General;  Laterality: Right;   COLONOSCOPY  04/22/2019   COLONOSCOPY  01/04/2016   COLPOSCOPY  1994   ASCUS   DILATION AND CURETTAGE OF UTERUS N/A 06/13/2014   Procedure: DILATATION AND CURETTAGE;  Surgeon: Patton Salles, MD;  Location: [facility];  Service: Gynecology;  Laterality: N/A;   HYSTEROSCOPY N/A 06/13/2014   Procedure: HYSTEROSCOPY WITH NOVASURE ABLATION and removal of malpositioned IUD ;  Surgeon: Patton Salles, MD;  Location: [facility];  Service: Gynecology;  Laterality: N/A;   LAPAROSCOPIC TUBAL LIGATION N/A 06/13/2014   Procedure: LAPAROSCOPIC BILATERAL TUBAL LIGATION WITH CAUTERY ;  Surgeon: Patton Salles, MD;  Location: [facility];  Service: Gynecology;  Laterality: N/A;   thyroid biopsy  10/11   hyplerplastic thyroid nodule- multinodular goiter   TUBAL LIGATION  06-13-14   WISDOM TOOTH EXTRACTION       ALLERGIES:  Allergies  Allergen Reactions    Elemental Sulfur Hives, Itching and Rash    Where applied only.   Codeine    Mupirocin Hives   Percocet [Oxycodone-Acetaminophen]    Sulfa Antibiotics Hives     CURRENT MEDICATIONS:  Outpatient Encounter Medications as of 10/29/2022  Medication Sig Note   Ibuprofen 200 MG CAPS Take by mouth.    methocarbamol (ROBAXIN) 500 MG tablet Take 1 tablet by mouth as needed. c    naproxen (NAPROSYN) 500 MG tablet Take 500 mg by mouth 2 (two) times daily as needed. Reported on 02/26/2015 02/26/2015: Received from: External Pharmacy Received Sig: Take 500 mg by mouth 2 (two) times daily with a meal.   tamoxifen (NOLVADEX) 20 MG tablet Take 1 tablet (20 mg total) by mouth daily.    traMADol (ULTRAM) 50 MG tablet Take 1 tablet (50 mg total) by mouth every 6 (six) hours as needed for moderate pain or severe pain.    No facility-administered encounter medications on file as of 10/29/2022.     ONCOLOGIC FAMILY HISTORY:  Family History  Problem Relation Age of Onset   Hypertension Mother    Hypertension Father    Colon polyps Father    Colon cancer  Neg Hx    Rectal cancer Neg Hx    Esophageal cancer Neg Hx    Stomach cancer Neg Hx      SOCIAL HISTORY:  Social History   Socioeconomic History   Marital status: Divorced    Spouse name: Not on file   Number of children: Not on file   Years of education: Not on file   Highest education level: Not on file  Occupational History   Not on file  Tobacco Use   Smoking status: Never   Smokeless tobacco: Never  Vaping Use   Vaping status: Never Used  Substance and Sexual Activity   Alcohol use: Yes    Alcohol/week: 2.0 - 3.0 standard drinks of alcohol    Types: 2 - 3 Glasses of wine per week   Drug use: No   Sexual activity: Yes    Partners: Male    Birth control/protection: Surgical    Comment: BTL  Other Topics Concern   Not on file  Social History Narrative   Not on file   Social Determinants of Health   Financial Resource  Strain: Not on file  Food Insecurity: No Food Insecurity (02/19/2022)   Hunger Vital Sign    Worried About Running Out of Food in the Last Year: Never true    Ran Out of Food in the Last Year: Never true  Transportation Needs: No Transportation Needs (02/19/2022)   PRAPARE - Administrator, Civil Service (Medical): No    Lack of Transportation (Non-Medical): No  Physical Activity: Inactive (02/19/2022)   Exercise Vital Sign    Days of Exercise per Week: 0 days    Minutes of Exercise per Session: 0 min  Stress: Stress Concern Present (02/19/2022)   [employer] of Occupational Health - Occupational Stress Questionnaire    Feeling of Stress : Rather much  Social Connections: Not on file  Intimate Partner Violence: Not on file     OBSERVATIONS/OBJECTIVE:  BP 116/66 (BP Location: Right Arm, Patient Position: Sitting)   Pulse 69   Temp 98 F (36.7 C) (Tympanic)   Resp 18   Ht 5\' 8"  (1.727 m)   Wt 151 lb 6.4 oz (68.7 kg)   SpO2 99%   BMI 23.02 kg/m  GENERAL: Patient is a well appearing female in no acute distress HEENT:  Sclerae anicteric.  Oropharynx clear and moist. No ulcerations or evidence of oropharyngeal candidiasis. Neck is supple.  NODES:  No cervical, supraclavicular, or axillary lymphadenopathy palpated.  BREAST EXAM: Right breast status postlumpectomy, benign, left breast status postlumpectomy and radiation no sign of local recurrence. LUNGS:  Clear to auscultation bilaterally.  No wheezes or rhonchi. HEART:  Regular rate and rhythm. No murmur appreciated. ABDOMEN:  Soft, nontender.  Positive, normoactive bowel sounds. No organomegaly palpated. MSK:  No focal spinal tenderness to palpation. Full range of motion bilaterally in the upper extremities. EXTREMITIES:  No peripheral edema.   SKIN:  Clear with no obvious rashes or skin changes. No nail dyscrasia. NEURO:  Nonfocal. Well oriented.  Appropriate affect.   LABORATORY DATA:  None for this  visit.  DIAGNOSTIC IMAGING:  None for this visit.      ASSESSMENT AND PLAN:  Ms.. [name] is a pleasant 57 y.o. female with Stage IA left breast invasive ductal carcinoma, ER+/PR+/HER2-, diagnosed in 01/2022, treated with lumpectomy, adjuvant radiation therapy, and anti-estrogen therapy with tamoxifen beginning in 04/2022.  She presents to the Survivorship Clinic for our initial meeting and routine  follow-up post-completion of treatment for breast cancer.    1. Stage 1A right/left breast cancer:  Ms. Garron is continuing to recover from definitive treatment for breast cancer. She will follow-up with her medical oncologist, Dr. Al Pimple in February 2025 with history and physical exam per surveillance protocol.  She will continue her anti-estrogen therapy with tamoxifen, see #2.  Her mammogram is due 01/2023; orders placed today.   Today, a comprehensive survivorship care plan and treatment summary was reviewed with the patient today detailing her breast cancer diagnosis, treatment course, potential late/long-term effects of treatment, appropriate follow-up care with recommendations for the future, and patient education resources.  A copy of this summary, along with a letter will be sent to the patient's primary care provider via mail/fax/In Basket message after today's visit.    2.  Leg cramping: I recommended that she take magnesium oxide 400 mg at night to see if this will help her discomfort.  She has a history of thyroid nodules we will also obtain a TSH and free T4 today after her appointment.  3. Bone health: She was given education on specific activities to promote bone health.  4. Cancer screening:  Due to Ms. Kreps's history and her age, she should receive screening for skin cancers, colon cancer, and gynecologic cancers.  The information and recommendations are listed on the patient's comprehensive care plan/treatment summary and were reviewed in detail with the patient.    5. Health maintenance and  wellness promotion: Ms. Campi was encouraged to consume 5-7 servings of fruits and vegetables per day. We reviewed the "Nutrition Rainbow" handout.  She was also encouraged to engage in moderate to vigorous exercise for 30 minutes per day most days of the week.  She was instructed to limit her alcohol consumption and continue to abstain from tobacco use.     6. Support services/counseling: It is not uncommon for this period of the patient's cancer care trajectory to be one of many emotions and stressors.   She was given information regarding our available services and encouraged to contact me with any questions or for help enrolling in any of our support group/programs.    Follow up instructions:    -Return to cancer center in 02/2023 for f/u with Dr. Al Pimple  -Mammogram due in 01/2023 -She is welcome to return back to the Survivorship Clinic at any time; no additional follow-up needed at this time.  -Consider referral back to survivorship as a long-term survivor for continued surveillance  The patient was provided an opportunity to ask questions and all were answered. The patient agreed with the plan and demonstrated an understanding of the instructions.   Total encounter time:45 minutes*in face-to-face visit time, chart review, lab review, care coordination, order entry, and documentation of the encounter time.    Lillard Anes, NP 10/29/22 9:44 AM Medical Oncology and Hematology [facility] [address] [place], [place] [address] Tel. [phone]    Fax. [phone]  *Total Encounter Time as defined by the Centers for Medicare and Medicaid Services includes, in addition to the face-to-face time of a patient visit (documented in the note above) non-face-to-face time: obtaining and reviewing outside history, ordering and reviewing medications, tests or procedures, care coordination (communications with other health care professionals or caregivers) and documentation in the  medical record.

## 2022-10-29 NOTE — Progress Notes (Signed)
MM order faxed to [facility] per NP. Fax confirmation received.

## 2022-11-19 NOTE — Progress Notes (Signed)
57 y.o. G32P2012 Divorced Caucasian female here for annual exam.  Currently on Tamoxifen and cycles have been super light. Started a 1 day, super light period April 9th.  No bleeding from December, 2023 through March, 2024.  Had light bleeding for one day in April.  Nothing since then.   Taking Tamoxifen for left breast cancer.  Some leg cramps and hot flashes.  Heat is tolerable.   Can leak with exercise, cough, laugh, sex.   PCP: Sigmund Hazel, MD   Patient's last menstrual period was 05/06/2022.           Sexually active: Yes.    The current method of family planning is BTL.     Yes.   Exercising:    walking, yoga Smoker:  no  OB History  Gravida Para Term Preterm AB Living  3 2 2   1 2   SAB IAB Ectopic Multiple Live Births          2    # Outcome Date GA Lbr Len/2nd Weight Sex Type Anes PTL Lv  3 Term 1994 [redacted]w[redacted]d  8 lb 6 oz (3.799 kg) M Vag-Spont   LIV  2 Term 1990 [redacted]w[redacted]d  7 lb 13 oz (3.544 kg) F Vag-Spont   LIV  1 AB              Health Maintenance: Pap:   11/26/2020 neg,neg hpv,10-29-15 Neg:Neg HR HPV,  08-30-12 Neg:Neg HR HPV   History of abnormal Pap:  yes, ASCUS 1994. Colpo and cryotherapy to cervix     MMG: 02/04/22 BI-RADS CAT 4 sus Colonoscopy:   HM Colonoscopy          Colonoscopy (Every 5 Years) Next due on 04/21/2024    04/22/2019  COLONOSCOPY   Only the first 1 history entries have been loaded, but more history exists.           BMD:  n/a  Result  n/a  HIV: 11/26/20 neg Hep C: 11/26/20 neg  Immunization History  Administered Date(s) Administered   Influenza,inj,Quad PF,6+ Mos 11/18/2018   PFIZER(Purple Top)SARS-COV-2 Vaccination 09/15/2019, 10/26/2019   Tdap 11/03/2016      reports that she has never smoked. She has never used smokeless tobacco. She reports current alcohol use of about 2.0 - 3.0 standard drinks of alcohol per week. She reports that she does not use drugs.  Past Medical History:  Diagnosis Date   Allergy    Anemia     Depression 2004   History - situational, no meds   Encounter for insertion of Mirena IUD 08/04/2008   Enlarged thyroid    being monitored yearly   History of COVID-19 02/2019   Hx of adenomatous polyp of colon 01/23/2016   Hypertriglyceridemia    Multiple thyroid nodules    being monitored yearly   Seasonal allergies    Seasonal allergies    SVD (spontaneous vaginal delivery)    x 2    Past Surgical History:  Procedure Laterality Date   APPENDECTOMY  1988   BREAST LUMPECTOMY WITH RADIOACTIVE SEED AND SENTINEL LYMPH NODE BIOPSY Left 03/05/2022   Procedure: LEFT BREAST LUMPECTOMY WITH RADIOACTIVE SEED AND SENTINEL LYMPH NODE BIOPSY;  Surgeon: Manus Rudd, MD;  Location: [facility];  Service: General;  Laterality: Left;  PEC BLOCK   BREAST LUMPECTOMY WITH RADIOACTIVE SEED LOCALIZATION Right 03/05/2022   Procedure: RIGHT BREAST LUMPECTOMY WITH RADIOACTIVE SEED LOCALIZATION;  Surgeon: Manus Rudd, MD;  Location: [facility];  Service: General;  Laterality: Right;   COLONOSCOPY  04/22/2019   COLONOSCOPY  01/04/2016   COLPOSCOPY  1994   ASCUS   DILATION AND CURETTAGE OF UTERUS N/A 06/13/2014   Procedure: DILATATION AND CURETTAGE;  Surgeon: Patton Salles, MD;  Location: [facility];  Service: Gynecology;  Laterality: N/A;   HYSTEROSCOPY N/A 06/13/2014   Procedure: HYSTEROSCOPY WITH NOVASURE ABLATION and removal of malpositioned IUD ;  Surgeon: Patton Salles, MD;  Location: [facility];  Service: Gynecology;  Laterality: N/A;   LAPAROSCOPIC TUBAL LIGATION N/A 06/13/2014   Procedure: LAPAROSCOPIC BILATERAL TUBAL LIGATION WITH CAUTERY ;  Surgeon: Patton Salles, MD;  Location: [facility];  Service: Gynecology;  Laterality: N/A;   thyroid biopsy  10/11   hyplerplastic thyroid nodule- multinodular goiter   TUBAL LIGATION  06-13-14   WISDOM TOOTH EXTRACTION      Current Outpatient Medications  Medication Sig Dispense Refill   Ibuprofen 200 MG  CAPS Take by mouth.     meloxicam (MOBIC) 15 MG tablet Take 15 mg by mouth daily.     methocarbamol (ROBAXIN) 500 MG tablet Take 1 tablet by mouth as needed. c     naproxen (NAPROSYN) 500 MG tablet Take 500 mg by mouth 2 (two) times daily as needed. Reported on 02/26/2015  2   tamoxifen (NOLVADEX) 20 MG tablet Take 1 tablet (20 mg total) by mouth daily. 90 tablet 3   No current facility-administered medications for this visit.    Family History  Problem Relation Age of Onset   Hypertension Mother    Hypertension Father    Colon polyps Father    Colon cancer Neg Hx    Rectal cancer Neg Hx    Esophageal cancer Neg Hx    Stomach cancer Neg Hx     Review of Systems  All other systems reviewed and are negative.   Exam:   BP 118/72 (BP Location: Right Arm, Patient Position: Sitting, Cuff Size: Normal)   Pulse 62   Ht 5' 7.5" (1.715 m)   Wt 152 lb (68.9 kg)   LMP 05/06/2022   SpO2 96%   BMI 23.46 kg/m     General appearance: alert, cooperative and appears stated age Head: normocephalic, without obvious abnormality, atraumatic Neck: no adenopathy, supple, symmetrical, trachea midline and thyroid normal to inspection and palpation Lungs: clear to auscultation bilaterally Breasts: left breast with increased periareolar coloration, left axillary scar, no masses or tenderness, No nipple retraction or dimpling, No nipple discharge or bleeding, No axillary adenopathy Right - normal appearance, no masses or tenderness, No nipple retraction or dimpling, No nipple discharge or bleeding, No axillary adenopathy Heart: regular rate and rhythm Abdomen: soft, non-tender; no masses, no organomegaly Extremities: extremities normal, atraumatic, no cyanosis or edema Skin: skin color, texture, turgor normal. No rashes or lesions Lymph nodes: cervical, supraclavicular, and axillary nodes normal. Neurologic: grossly normal  Pelvic: External genitalia:  no lesions              No abnormal inguinal  nodes palpated.              Urethra:  normal appearing urethra with no masses, tenderness or lesions              Bartholins and Skenes: normal                 Vagina: normal appearing vagina with normal color and discharge, no lesions  Cervix: no lesions              Pap taken: No. Bimanual Exam:  Uterus:  normal size, contour, position, consistency, mobility, non-tender              Adnexa: no mass, fullness, tenderness              Rectal exam: Yes.  .  Confirms.              Anus:  normal sphincter tone, no lesions  Chaperone was present for exam:  Warren Lacy, CMA   Assessment:  Well woman visit with gynecologic exam. Status post BTL. Left breast cancer.  Status post lumpectomy, XRT, on Tamoxifen.  Status post right breast lumpectomy.  Benign.  Declined genetic testing.  Remote history of cryotherapy to cervix.  Stress urinary incontinence.  Enlarged thyroid monitored by Dr. Talmage Nap.  Recent TFTs normal.   Plan: Mammogram due in January.  Self breast awareness reviewed. Will check FSH and estradiol to check menopausal status.  Potential tamoxifen effect on endometrium discussed:  polyp formation, hyperplasia, and cancer. We discussed importance of evaluating postmenopausal bleeding.  Pap and HR HPV testing in 2027.  Pelvic floor physical therapy and midurethral sling for urinary incontinence briefly mentioned.  Guidelines for Calcium, Vitamin D, regular exercise program including cardiovascular and weight bearing exercise. Fu yearly and prn.

## 2022-12-03 ENCOUNTER — Ambulatory Visit (INDEPENDENT_AMBULATORY_CARE_PROVIDER_SITE_OTHER): Payer: BC Managed Care – PPO | Admitting: Obstetrics and Gynecology

## 2022-12-03 ENCOUNTER — Encounter: Payer: Self-pay | Admitting: Obstetrics and Gynecology

## 2022-12-03 VITALS — BP 118/72 | HR 62 | Ht 67.5 in | Wt 152.0 lb

## 2022-12-03 DIAGNOSIS — Z01419 Encounter for gynecological examination (general) (routine) without abnormal findings: Secondary | ICD-10-CM

## 2022-12-03 DIAGNOSIS — N951 Menopausal and female climacteric states: Secondary | ICD-10-CM | POA: Diagnosis not present

## 2022-12-03 NOTE — Patient Instructions (Signed)

## 2022-12-04 LAB — FOLLICLE STIMULATING HORMONE: FSH: 29.5 m[IU]/mL

## 2022-12-04 LAB — ESTRADIOL: Estradiol: <15 pg/mL

## 2022-12-08 ENCOUNTER — Ambulatory Visit: Payer: BC Managed Care – PPO | Attending: Surgery

## 2022-12-08 VITALS — Wt 152.1 lb

## 2022-12-08 DIAGNOSIS — Z483 Aftercare following surgery for neoplasm: Secondary | ICD-10-CM | POA: Insufficient documentation

## 2022-12-08 NOTE — Therapy (Signed)
OUTPATIENT PHYSICAL THERAPY SOZO SCREENING NOTE   Patient Name: Chelsea [name] MRN: [id number] DOB:1965/09/03, 57 y.o., female Today's Date: 12/08/2022  PCP: Sigmund Hazel, MD REFERRING PROVIDER: Manus Rudd, MD   PT End of Session - 12/08/22 [phone]     Visit Number 2   # unchanged due to screen only   PT Start Time 0815    PT Stop Time 0819    PT Time Calculation (min) 4 min    Activity Tolerance Patient tolerated treatment well    Behavior During Therapy [facility] for tasks assessed/performed             Past Medical History:  Diagnosis Date   Allergy    Anemia    Depression 2004   History - situational, no meds   Encounter for insertion of Mirena IUD 08/04/2008   Enlarged thyroid    being monitored yearly   History of COVID-19 02/2019   Hx of adenomatous polyp of colon 01/23/2016   Hypertriglyceridemia    Multiple thyroid nodules    being monitored yearly   Seasonal allergies    Seasonal allergies    SVD (spontaneous vaginal delivery)    x 2   Past Surgical History:  Procedure Laterality Date   APPENDECTOMY  1988   BREAST LUMPECTOMY WITH RADIOACTIVE SEED AND SENTINEL LYMPH NODE BIOPSY Left 03/05/2022   Procedure: LEFT BREAST LUMPECTOMY WITH RADIOACTIVE SEED AND SENTINEL LYMPH NODE BIOPSY;  Surgeon: Manus Rudd, MD;  Location: [facility];  Service: General;  Laterality: Left;  PEC BLOCK   BREAST LUMPECTOMY WITH RADIOACTIVE SEED LOCALIZATION Right 03/05/2022   Procedure: RIGHT BREAST LUMPECTOMY WITH RADIOACTIVE SEED LOCALIZATION;  Surgeon: Manus Rudd, MD;  Location: [facility];  Service: General;  Laterality: Right;   COLONOSCOPY  04/22/2019   COLONOSCOPY  01/04/2016   COLPOSCOPY  1994   ASCUS   DILATION AND CURETTAGE OF UTERUS N/A 06/13/2014   Procedure: DILATATION AND CURETTAGE;  Surgeon: Patton Salles, MD;  Location: [facility];  Service: Gynecology;  Laterality: N/A;   HYSTEROSCOPY N/A 06/13/2014   Procedure: HYSTEROSCOPY  WITH NOVASURE ABLATION and removal of malpositioned IUD ;  Surgeon: Patton Salles, MD;  Location: [facility];  Service: Gynecology;  Laterality: N/A;   LAPAROSCOPIC TUBAL LIGATION N/A 06/13/2014   Procedure: LAPAROSCOPIC BILATERAL TUBAL LIGATION WITH CAUTERY ;  Surgeon: Patton Salles, MD;  Location: [facility];  Service: Gynecology;  Laterality: N/A;   thyroid biopsy  10/11   hyplerplastic thyroid nodule- multinodular goiter   TUBAL LIGATION  06-13-14   WISDOM TOOTH EXTRACTION     Patient Active Problem List   Diagnosis Date Noted   Nontoxic single thyroid nodule 10/29/2022   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 It measures 0.8 cm and is located in the upper outer quadrant. It is ER/PR positive and HER2 negative with a Ki67 of 40%. She has a complex sclerosing lesion on the right side.  bil lumpectomy on 03/05/22 with left SLNB with 1 negative node removed.   PRECAUTIONS: left UE Lymphedema risk, None  SUBJECTIVE: Pt returns for  her 3 month L-Dex screen.   PAIN:  Are you having pain? No  SOZO SCREENING: Patient was assessed today using the SOZO machine to determine the lymphedema index score. This was compared to her baseline score. It was determined that she is within the recommended range when compared to her baseline and no further action is needed at this time. She will continue SOZO screenings. These are done every 3 months for 2 years post operatively followed by every 6 months for 2 years, and then annually. Encouraged pt to resume wearing her compression bra 24/7 as able for next few  months as she has just finished radiation.    L-DEX FLOWSHEETS - 12/08/22 0800       L-DEX LYMPHEDEMA SCREENING   Measurement Type Unilateral    L-DEX MEASUREMENT EXTREMITY Upper Extremity    POSITION  Standing    DOMINANT SIDE Right    At Risk Side Left    BASELINE SCORE (UNILATERAL) 0.7    L-DEX SCORE (UNILATERAL) -0.4    VALUE CHANGE (UNILAT) -1.1               Hermenia Bers, PTA 12/08/2022, 8:18 AM

## 2023-02-11 ENCOUNTER — Encounter: Payer: Self-pay | Admitting: Obstetrics and Gynecology

## 2023-03-09 ENCOUNTER — Ambulatory Visit: Payer: 59 | Attending: Surgery

## 2023-03-09 VITALS — Wt 156.1 lb

## 2023-03-09 DIAGNOSIS — Z483 Aftercare following surgery for neoplasm: Secondary | ICD-10-CM

## 2023-03-09 NOTE — Therapy (Signed)
 OUTPATIENT PHYSICAL THERAPY SOZO SCREENING NOTE   Patient Name: Chelsea [name] MRN: [id number] DOB:05-01-1965, 58 y.o., female Today's Date: 03/09/2023  PCP: Perley Bradley, MD REFERRING PROVIDER: Dareen Ebbing, MD   PT End of Session - 03/09/23 1503     Visit Number 2   # unchanged due to screen only   PT Start Time 1501    PT Stop Time 1505    PT Time Calculation (min) 4 min    Activity Tolerance Patient tolerated treatment well    Behavior During Therapy [facility] for tasks assessed/performed             Past Medical History:  Diagnosis Date   Allergy    Anemia    Depression 2004   History - situational, no meds   Encounter for insertion of Mirena IUD 08/04/2008   Enlarged thyroid     being monitored yearly   History of COVID-19 02/2019   Hx of adenomatous polyp of colon 01/23/2016   Hypertriglyceridemia    Multiple thyroid  nodules    being monitored yearly   Seasonal allergies    Seasonal allergies    SVD (spontaneous vaginal delivery)    x 2   Past Surgical History:  Procedure Laterality Date   APPENDECTOMY  1988   BREAST LUMPECTOMY WITH RADIOACTIVE SEED AND SENTINEL LYMPH NODE BIOPSY Left 03/05/2022   Procedure: LEFT BREAST LUMPECTOMY WITH RADIOACTIVE SEED AND SENTINEL LYMPH NODE BIOPSY;  Surgeon: Dareen Ebbing, MD;  Location: [facility];  Service: General;  Laterality: Left;  PEC BLOCK   BREAST LUMPECTOMY WITH RADIOACTIVE SEED LOCALIZATION Right 03/05/2022   Procedure: RIGHT BREAST LUMPECTOMY WITH RADIOACTIVE SEED LOCALIZATION;  Surgeon: Dareen Ebbing, MD;  Location: [facility];  Service: General;  Laterality: Right;   COLONOSCOPY  04/22/2019   COLONOSCOPY  01/04/2016   COLPOSCOPY  1994   ASCUS   DILATION AND CURETTAGE OF UTERUS N/A 06/13/2014   Procedure: DILATATION AND CURETTAGE;  Surgeon: Greta Leatherwood, MD;  Location: [facility];  Service: Gynecology;  Laterality: N/A;   HYSTEROSCOPY N/A 06/13/2014   Procedure: HYSTEROSCOPY  WITH NOVASURE ABLATION and removal of malpositioned IUD ;  Surgeon: Greta Leatherwood, MD;  Location: [facility];  Service: Gynecology;  Laterality: N/A;   LAPAROSCOPIC TUBAL LIGATION N/A 06/13/2014   Procedure: LAPAROSCOPIC BILATERAL TUBAL LIGATION WITH CAUTERY ;  Surgeon: Greta Leatherwood, MD;  Location: [facility];  Service: Gynecology;  Laterality: N/A;   thyroid  biopsy  10/11   hyplerplastic thyroid  nodule- multinodular goiter   TUBAL LIGATION  06-13-14   WISDOM TOOTH EXTRACTION     Patient Active Problem List   Diagnosis Date Noted   Nontoxic single thyroid  nodule 10/29/2022   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 It measures 0.8 cm and is located in the upper outer quadrant. It is ER/PR positive and HER2 negative with a Ki67 of 40%. She has a complex sclerosing lesion on the right side.  bil lumpectomy on 03/05/22 with left SLNB with 1 negative node removed.   PRECAUTIONS: left UE Lymphedema risk, None  SUBJECTIVE: Pt returns for  her 3 month L-Dex screen.   PAIN:  Are you having pain? No  SOZO SCREENING: Patient was assessed today using the SOZO machine to determine the lymphedema index score. This was compared to her baseline score. It was determined that she is within the recommended range when compared to her baseline and no further action is needed at this time. She will continue SOZO screenings. These are done every 3 months for 2 years post operatively followed by every 6 months for 2 years, and then annually. Encouraged pt to resume wearing her compression bra 24/7 as able for next few  months as she has just finished radiation.    L-DEX FLOWSHEETS - 03/09/23 1500       L-DEX LYMPHEDEMA SCREENING   Measurement Type Unilateral    L-DEX MEASUREMENT EXTREMITY Upper Extremity    POSITION  Standing    DOMINANT SIDE Right    At Risk Side Left    BASELINE SCORE (UNILATERAL) 0.7    L-DEX SCORE (UNILATERAL) 0.7    VALUE CHANGE (UNILAT) 0               Denyce Flank, PTA 03/09/2023, 3:06 PM

## 2023-03-26 NOTE — Progress Notes (Unsigned)
 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  Ultrasound of the breast confirmed 5 x 5 x 4 mm irregular mass in right breast at 3:00 middle depth suspicious for malignancy.  Biopsy recommended.  6 x 3 x 5 mm irregular mass in the right breast at 1:00 anterior depth suspicious of malignancy, biopsy recommended.  8 x 6 x 3 mm irregular mass in the left breast at 1:00 middle depth suspicious of malignancy.  Biopsy recommended   02/10/2022 Pathology Results   Left breast needle core biopsy at 1:00 5 cm from the nipple shows invasive mammary carcinoma, overall grade 2.  Right breast needle core biopsy at 3:00 3 cm from the nipple shows CSL.  Right breast needle core biopsy 1:00 2 cm from nipple shows benign breast parenchyma with PASH, no malignancy identified.  Prognostics from the left breast biopsy showed ER 80% positive strong staining PR 50% positive strong staining Ki-67 of 40% and HER2 negative by IHC.   02/17/2022 Cancer Staging   Staging form: Breast, AJCC 8th Edition - Clinical stage from 02/17/2022: Stage IA (cT1b, cN0, cM0, G2, ER+, PR+, HER2-) - Signed by Ronny Bacon, PA-C on 02/17/2022 Stage prefix: Initial diagnosis Method of lymph node assessment: Clinical Histologic grading system: 3 grade system   03/05/2022 Surgery   Right breast lumpectomy: CSL, no evidence of malignancy Left breast lumpectomy: IDC, 2.1cm, grade 2, margins negative, ER/PR+, HER2-.  1 SLN negative.    03/05/2022 Cancer Staging   Staging form: Breast, AJCC 8th Edition - Pathologic stage from 03/05/2022: Stage IA (pT2, pN0, cM0, G2, ER+, PR+, HER2-) - Signed by Loa Socks, NP on 10/29/2022 Histologic grading system: 3 grade system   03/05/2022 Oncotype testing   24/10%    04/24/2022 - 05/21/2022 Radiation Therapy   Plan Name: Breast_L_BH Site: Breast, Left Technique: 3D Mode: Photon Dose Per Fraction: 2.66 Gy Prescribed Dose (Delivered / Prescribed): 42.56 Gy / 42.56 Gy Prescribed Fxs (Delivered / Prescribed): 16 / 16   Plan Name: Brst_L_BH_Bst Site: Breast, Left Technique: 3D Mode: Photon Dose Per Fraction: 2 Gy Prescribed Dose (Delivered / Prescribed): 8 Gy / 8 Gy Prescribed Fxs (Delivered / Prescribed): 4 / 4   04/2022 -  Anti-estrogen oral therapy   Tamoxifen     INTERVAL HISTORY:  She is here for follow up while on tamoxifen.  REVIEW OF SYSTEMS:  Review of Systems  Constitutional:  Negative for appetite change, chills, fatigue, fever and unexpected weight change.  HENT:   Negative for hearing loss, lump/mass and trouble swallowing.   Eyes:  Negative for eye problems and icterus.  Respiratory:  Negative for chest tightness, cough and shortness of breath.   Cardiovascular:  Negative for chest pain, leg swelling and palpitations.  Gastrointestinal:  Negative for abdominal distention, abdominal pain, constipation, diarrhea, nausea and vomiting.  Endocrine: Positive for hot flashes.  Genitourinary:  Negative for difficulty urinating.   Musculoskeletal:  Negative for arthralgias.  Skin:  Negative for itching and rash.  Neurological:  Negative for dizziness, extremity weakness, headaches and numbness.  Hematological:  Negative for adenopathy. Does not bruise/bleed easily.  Psychiatric/Behavioral:  Negative for depression. The patient is not nervous/anxious.    Breast: Denies any new nodularity, masses, tenderness, nipple changes, or nipple discharge.  PAST MEDICAL/SURGICAL HISTORY:  Past Medical History:  Diagnosis Date   Allergy    Anemia    Depression 2004   History - situational, no meds   Encounter for insertion of Mirena IUD 08/04/2008   Enlarged thyroid    being monitored yearly   History of COVID-19 02/2019   Hx of  adenomatous polyp of colon 01/23/2016   Hypertriglyceridemia    Multiple thyroid nodules    being monitored yearly   Seasonal allergies    Seasonal allergies    SVD (spontaneous vaginal delivery)    x 2   Past Surgical History:  Procedure Laterality Date   APPENDECTOMY  1988   BREAST LUMPECTOMY WITH RADIOACTIVE SEED AND SENTINEL LYMPH NODE BIOPSY Left 03/05/2022   Procedure: LEFT BREAST LUMPECTOMY WITH RADIOACTIVE SEED AND SENTINEL LYMPH NODE BIOPSY;  Surgeon: Manus Rudd, MD;  Location: [facility];  Service: General;  Laterality: Left;  PEC BLOCK   BREAST LUMPECTOMY WITH RADIOACTIVE SEED LOCALIZATION Right 03/05/2022   Procedure: RIGHT BREAST LUMPECTOMY WITH RADIOACTIVE SEED LOCALIZATION;  Surgeon: Manus Rudd, MD;  Location: [facility];  Service: General;  Laterality: Right;   COLONOSCOPY  04/22/2019   COLONOSCOPY  01/04/2016   COLPOSCOPY  1994   ASCUS   DILATION AND CURETTAGE OF UTERUS N/A 06/13/2014   Procedure: DILATATION AND CURETTAGE;  Surgeon: Patton Salles, MD;  Location: [facility];  Service: Gynecology;  Laterality: N/A;   HYSTEROSCOPY N/A 06/13/2014   Procedure: HYSTEROSCOPY WITH NOVASURE ABLATION and removal of malpositioned IUD ;  Surgeon: Patton Salles, MD;  Location: [facility];  Service: Gynecology;  Laterality: N/A;   LAPAROSCOPIC TUBAL LIGATION N/A 06/13/2014   Procedure: LAPAROSCOPIC BILATERAL TUBAL LIGATION WITH CAUTERY ;  Surgeon: Patton Salles, MD;  Location: [facility];  Service: Gynecology;  Laterality: N/A;   thyroid biopsy  10/11   hyplerplastic thyroid nodule- multinodular goiter   TUBAL LIGATION  06-13-14   WISDOM TOOTH EXTRACTION       ALLERGIES:  Allergies  Allergen Reactions   Elemental Sulfur Hives, Itching and Rash    Where applied only.   Codeine    Mupirocin Hives   Percocet [Oxycodone-Acetaminophen]    Sulfa Antibiotics Hives     CURRENT MEDICATIONS:  Outpatient Encounter Medications as  of 03/27/2023  Medication Sig Note   Ibuprofen 200 MG CAPS Take by mouth.    meloxicam (MOBIC) 15 MG tablet Take 15 mg by mouth daily.    methocarbamol (ROBAXIN) 500 MG tablet Take 1 tablet by mouth as needed. c    naproxen (NAPROSYN) 500 MG tablet Take 500 mg by mouth 2 (two) times daily as needed. Reported on 02/26/2015 02/26/2015: Received from: External Pharmacy Received Sig: Take 500 mg by mouth 2 (two) times daily with a meal.   tamoxifen (NOLVADEX) 20 MG tablet Take 1 tablet (20 mg total) by mouth daily.    No facility-administered encounter medications on file as of 03/27/2023.     ONCOLOGIC FAMILY HISTORY:  Family History  Problem Relation Age of Onset   Hypertension Mother    Hypertension Father    Colon polyps Father    Colon cancer Neg Hx    Rectal cancer Neg Hx    Esophageal cancer Neg Hx    Stomach cancer Neg Hx      SOCIAL HISTORY:  Social History   Socioeconomic History   Marital status: Divorced    Spouse name: Not on  file   Number of children: Not on file   Years of education: Not on file   Highest education level: Not on file  Occupational History   Not on file  Tobacco Use   Smoking status: Never   Smokeless tobacco: Never  Vaping Use   Vaping status: Never Used  Substance and Sexual Activity   Alcohol use: Yes    Alcohol/week: 2.0 - 3.0 standard drinks of alcohol    Types: 2 - 3 Glasses of wine per week   Drug use: No   Sexual activity: Yes    Partners: Male    Birth control/protection: Surgical    Comment: BTL  Other Topics Concern   Not on file  Social History Narrative   Not on file   Social Drivers of Health   Financial Resource Strain: Not on file  Food Insecurity: No Food Insecurity (02/19/2022)   Hunger Vital Sign    Worried About Running Out of Food in the Last Year: Never true    Ran Out of Food in the Last Year: Never true  Transportation Needs: No Transportation Needs (02/19/2022)   PRAPARE - Scientist, research (physical sciences) (Medical): No    Lack of Transportation (Non-Medical): No  Physical Activity: Inactive (02/19/2022)   Exercise Vital Sign    Days of Exercise per Week: 0 days    Minutes of Exercise per Session: 0 min  Stress: Stress Concern Present (02/19/2022)   [employer] of Occupational Health - Occupational Stress Questionnaire    Feeling of Stress : Rather much  Social Connections: Not on file  Intimate Partner Violence: Not on file     OBSERVATIONS/OBJECTIVE:  There were no vitals taken for this visit. GENERAL: Patient is a well appearing female in no acute distress HEENT:  Sclerae anicteric.  Oropharynx clear and moist. No ulcerations or evidence of oropharyngeal candidiasis. Neck is supple.  NODES:  No cervical, supraclavicular, or axillary lymphadenopathy palpated.  BREAST EXAM: Right breast status postlumpectomy, benign, left breast status postlumpectomy and radiation no sign of local recurrence. LUNGS:  Clear to auscultation bilaterally.  No wheezes or rhonchi. HEART:  Regular rate and rhythm. No murmur appreciated. ABDOMEN:  Soft, nontender.  Positive, normoactive bowel sounds. No organomegaly palpated. MSK:  No focal spinal tenderness to palpation. Full range of motion bilaterally in the upper extremities. EXTREMITIES:  No peripheral edema.   SKIN:  Clear with no obvious rashes or skin changes. No nail dyscrasia. NEURO:  Nonfocal. Well oriented.  Appropriate affect.   LABORATORY DATA:  None for this visit.  DIAGNOSTIC IMAGING:  None for this visit.      ASSESSMENT AND PLAN:  Ms.. Neises is a pleasant 58 y.o. female with Stage IA left breast invasive ductal carcinoma, ER+/PR+/HER2-, diagnosed in 01/2022, treated with lumpectomy, adjuvant radiation therapy, and anti-estrogen therapy with tamoxifen beginning in 04/2022.    Total encounter time:45 minutes*in face-to-face visit time, chart review, lab review, care coordination, order entry, and documentation of the  encounter time.   *Total Encounter Time as defined by the Centers for Medicare and Medicaid Services includes, in addition to the face-to-face time of a patient visit (documented in the note above) non-face-to-face time: obtaining and reviewing outside history, ordering and reviewing medications, tests or procedures, care coordination (communications with other health care professionals or caregivers) and documentation in the medical record.

## 2023-03-27 ENCOUNTER — Inpatient Hospital Stay: Payer: 59 | Attending: Hematology and Oncology | Admitting: Hematology and Oncology

## 2023-03-27 ENCOUNTER — Inpatient Hospital Stay: Payer: 59

## 2023-03-27 VITALS — BP 126/74 | HR 68 | Temp 97.9°F | Resp 17 | Wt 152.5 lb

## 2023-03-27 DIAGNOSIS — Z1721 Progesterone receptor positive status: Secondary | ICD-10-CM | POA: Diagnosis not present

## 2023-03-27 DIAGNOSIS — C50212 Malignant neoplasm of upper-inner quadrant of left female breast: Secondary | ICD-10-CM

## 2023-03-27 DIAGNOSIS — Z79810 Long term (current) use of selective estrogen receptor modulators (SERMs): Secondary | ICD-10-CM | POA: Insufficient documentation

## 2023-03-27 DIAGNOSIS — Z1732 Human epidermal growth factor receptor 2 negative status: Secondary | ICD-10-CM | POA: Insufficient documentation

## 2023-03-27 DIAGNOSIS — Z170 Estrogen receptor positive status [ER+]: Secondary | ICD-10-CM | POA: Diagnosis not present

## 2023-03-27 LAB — CBC WITH DIFFERENTIAL/PLATELET
Abs Immature Granulocytes: 0.01 10*3/uL (ref 0.00–0.07)
Basophils Absolute: 0 10*3/uL (ref 0.0–0.1)
Basophils Relative: 1 %
Eosinophils Absolute: 0.1 10*3/uL (ref 0.0–0.5)
Eosinophils Relative: 3 %
HCT: 37.5 % (ref 36.0–46.0)
Hemoglobin: 12.7 g/dL (ref 12.0–15.0)
Immature Granulocytes: 0 %
Lymphocytes Relative: 30 %
Lymphs Abs: 1 10*3/uL (ref 0.7–4.0)
MCH: 32.6 pg (ref 26.0–34.0)
MCHC: 33.9 g/dL (ref 30.0–36.0)
MCV: 96.4 fL (ref 80.0–100.0)
Monocytes Absolute: 0.3 10*3/uL (ref 0.1–1.0)
Monocytes Relative: 9 %
Neutro Abs: 1.8 10*3/uL (ref 1.7–7.7)
Neutrophils Relative %: 57 %
Platelets: 276 10*3/uL (ref 150–400)
RBC: 3.89 MIL/uL (ref 3.87–5.11)
RDW: 11.9 % (ref 11.5–15.5)
WBC: 3.1 10*3/uL — ABNORMAL LOW (ref 4.0–10.5)
nRBC: 0 % (ref 0.0–0.2)

## 2023-03-27 LAB — CMP (CANCER CENTER ONLY)
ALT: 14 U/L (ref 0–44)
AST: 18 U/L (ref 15–41)
Albumin: 4.2 g/dL (ref 3.5–5.0)
Alkaline Phosphatase: 39 U/L (ref 38–126)
Anion gap: 6 (ref 5–15)
BUN: 18 mg/dL (ref 6–20)
CO2: 30 mmol/L (ref 22–32)
Calcium: 9.3 mg/dL (ref 8.9–10.3)
Chloride: 105 mmol/L (ref 98–111)
Creatinine: 0.7 mg/dL (ref 0.44–1.00)
GFR, Estimated: >60 mL/min (ref >60)
Glucose, Bld: 99 mg/dL (ref 70–99)
Potassium: 4.5 mmol/L (ref 3.5–5.1)
Sodium: 141 mmol/L (ref 135–145)
Total Bilirubin: 0.5 mg/dL (ref 0.0–1.2)
Total Protein: 7 g/dL (ref 6.5–8.1)

## 2023-05-07 ENCOUNTER — Other Ambulatory Visit: Payer: Self-pay | Admitting: Hematology and Oncology

## 2023-06-08 ENCOUNTER — Ambulatory Visit: Payer: 59 | Attending: Surgery

## 2023-06-08 VITALS — Wt 156.0 lb

## 2023-06-08 DIAGNOSIS — Z483 Aftercare following surgery for neoplasm: Secondary | ICD-10-CM | POA: Insufficient documentation

## 2023-06-08 NOTE — Therapy (Signed)
 OUTPATIENT PHYSICAL THERAPY SOZO SCREENING NOTE   Patient Name: Chelsea [name] MRN: [id number] DOB:Jul 12, 1965, 58 y.o., female Today's Date: 06/08/2023  PCP: Perley Bradley, MD REFERRING PROVIDER: Dareen Ebbing, MD   PT End of Session - 06/08/23 1612     Visit Number 2   # unchanged due to screen only   PT Start Time 1611    PT Stop Time 1615    PT Time Calculation (min) 4 min    Activity Tolerance Patient tolerated treatment well    Behavior During Therapy [facility] for tasks assessed/performed             Past Medical History:  Diagnosis Date   Allergy    Anemia    Depression 2004   History - situational, no meds   Encounter for insertion of Mirena IUD 08/04/2008   Enlarged thyroid     being monitored yearly   History of COVID-19 02/2019   Hx of adenomatous polyp of colon 01/23/2016   Hypertriglyceridemia    Multiple thyroid  nodules    being monitored yearly   Seasonal allergies    Seasonal allergies    SVD (spontaneous vaginal delivery)    x 2   Past Surgical History:  Procedure Laterality Date   APPENDECTOMY  1988   BREAST LUMPECTOMY WITH RADIOACTIVE SEED AND SENTINEL LYMPH NODE BIOPSY Left 03/05/2022   Procedure: LEFT BREAST LUMPECTOMY WITH RADIOACTIVE SEED AND SENTINEL LYMPH NODE BIOPSY;  Surgeon: Dareen Ebbing, MD;  Location: [facility];  Service: General;  Laterality: Left;  PEC BLOCK   BREAST LUMPECTOMY WITH RADIOACTIVE SEED LOCALIZATION Right 03/05/2022   Procedure: RIGHT BREAST LUMPECTOMY WITH RADIOACTIVE SEED LOCALIZATION;  Surgeon: Dareen Ebbing, MD;  Location: [facility];  Service: General;  Laterality: Right;   COLONOSCOPY  04/22/2019   COLONOSCOPY  01/04/2016   COLPOSCOPY  1994   ASCUS   DILATION AND CURETTAGE OF UTERUS N/A 06/13/2014   Procedure: DILATATION AND CURETTAGE;  Surgeon: Greta Leatherwood, MD;  Location: [facility];  Service: Gynecology;  Laterality: N/A;   HYSTEROSCOPY N/A 06/13/2014   Procedure: HYSTEROSCOPY  WITH NOVASURE ABLATION and removal of malpositioned IUD ;  Surgeon: Greta Leatherwood, MD;  Location[facility];  Service: Gynecology;  Laterality: N/A;   LAPAROSCOPIC TUBAL LIGATION N/A 06/13/2014   Procedure: LAPAROSCOPIC BILATERAL TUBAL LIGATION WITH CAUTERY ;  Surgeon: Greta Leatherwood, MD;  Location[facility];  Service: Gynecology;  Laterality: N/A;   thyroid  biopsy  10/11   hyplerplastic thyroid  nodule- multinodular goiter   TUBAL LIGATION  06-13-14   WISDOM TOOTH EXTRACTION     Patient Active Problem List   Diagnosis Date Noted   Nontoxic single thyroid  nodule 10/29/2022   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 It measures 0.8 cm and is located in the upper outer quadrant. It is ER/PR positive and HER2 negative with a Ki67 of 40%. She has a complex sclerosing lesion on the right side.  bil lumpectomy on 03/05/22 with left SLNB with 1 negative node removed.   PRECAUTIONS: left UE Lymphedema risk, None  SUBJECTIVE: Pt returns for  her 3 month L-Dex screen.   PAIN:  Are you having pain? No  SOZO SCREENING: Patient was assessed today using the SOZO machine to determine the lymphedema index score. This was compared to her baseline score. It was determined that she is within the recommended range when compared to her baseline and no further action is needed at this time. She will continue SOZO screenings. These are done every 3 months for 2 years post operatively followed by every 6 months for 2 years, and then annually. Encouraged pt to resume wearing her compression bra 24/7 as able for next few  months as she has just finished radiation.    L-DEX FLOWSHEETS - 06/08/23 1600       L-DEX LYMPHEDEMA SCREENING   Measurement Type Unilateral    L-DEX MEASUREMENT EXTREMITY Upper Extremity    POSITION  Standing    DOMINANT SIDE Right    At Risk Side Left    BASELINE SCORE (UNILATERAL) 0.7    L-DEX SCORE (UNILATERAL) 0.8    VALUE CHANGE (UNILAT) 0.1               Denyce Flank, PTA 06/08/2023, 4:13 PM

## 2023-09-07 ENCOUNTER — Ambulatory Visit: Attending: Surgery

## 2023-09-07 VITALS — Wt 161.5 lb

## 2023-09-07 DIAGNOSIS — Z483 Aftercare following surgery for neoplasm: Secondary | ICD-10-CM | POA: Insufficient documentation

## 2023-09-07 NOTE — Therapy (Signed)
 OUTPATIENT PHYSICAL THERAPY SOZO SCREENING NOTE   Patient Name: Chelsea [name] MRN: [id number] DOB:[birth year], 58 y.o., female Today's Date: 09/07/2023  PCP: Chelsea Planas, MD REFERRING PROVIDER: Belinda Cough, MD   PT End of Session - 09/07/23 1556     Visit Number 2   # unchanged due to screen only   PT Start Time 1554    PT Stop Time 1558    PT Time Calculation (min) 4 min    Activity Tolerance Patient tolerated treatment well    Behavior During Therapy [facility] for tasks assessed/performed          Past Medical History:  Diagnosis Date   Allergy    Anemia    Depression 2004   History - situational, no meds   Encounter for insertion of Mirena IUD 08/04/2008   Enlarged thyroid     being monitored yearly   History of COVID-19 02/2019   Hx of adenomatous polyp of colon 01/23/2016   Hypertriglyceridemia    Multiple thyroid  nodules    being monitored yearly   Seasonal allergies    Seasonal allergies    SVD (spontaneous vaginal delivery)    x 2   Past Surgical History:  Procedure Laterality Date   APPENDECTOMY  1988   BREAST LUMPECTOMY WITH RADIOACTIVE SEED AND SENTINEL LYMPH NODE BIOPSY Left 03/05/2022   Procedure: LEFT BREAST LUMPECTOMY WITH RADIOACTIVE SEED AND SENTINEL LYMPH NODE BIOPSY;  Surgeon: Chelsea Cough, MD;  Location: [facility];  Service: General;  Laterality: Left;  PEC BLOCK   BREAST LUMPECTOMY WITH RADIOACTIVE SEED LOCALIZATION Right 03/05/2022   Procedure: RIGHT BREAST LUMPECTOMY WITH RADIOACTIVE SEED LOCALIZATION;  Surgeon: Chelsea Cough, MD;  Location: [facility];  Service: General;  Laterality: Right;   COLONOSCOPY  04/22/2019   COLONOSCOPY  01/04/2016   COLPOSCOPY  1994   ASCUS   DILATION AND CURETTAGE OF UTERUS N/A 06/13/2014   Procedure: DILATATION AND CURETTAGE;  Surgeon: Chelsea FORBES Cathlyn JAYSON Nikki, MD;  Location: [facility];  Service: Gynecology;  Laterality: N/A;   HYSTEROSCOPY N/A 06/13/2014   Procedure: HYSTEROSCOPY WITH  NOVASURE ABLATION and removal of malpositioned IUD ;  Surgeon: Chelsea FORBES Cathlyn JAYSON Nikki, MD;  Location: [facility];  Service: Gynecology;  Laterality: N/A;   LAPAROSCOPIC TUBAL LIGATION N/A 06/13/2014   Procedure: LAPAROSCOPIC BILATERAL TUBAL LIGATION WITH CAUTERY ;  Surgeon: Chelsea FORBES Cathlyn JAYSON Nikki, MD;  Location: [facility];  Service: Gynecology;  Laterality: N/A;   thyroid  biopsy  10/11   hyplerplastic thyroid  nodule- multinodular goiter   TUBAL LIGATION  06-13-14   WISDOM TOOTH EXTRACTION     Patient Active Problem List   Diagnosis Date Noted   Nontoxic single thyroid  nodule 10/29/2022   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 It measures 0.8 cm and is located in the upper outer quadrant. It is ER/PR positive and HER2 negative with a Ki67 of 40%. She has a complex sclerosing lesion on the right side.  bil lumpectomy on 03/05/22 with left SLNB with 1 negative node removed.   PRECAUTIONS: left UE Lymphedema risk, None  SUBJECTIVE: Pt returns for her 3 month  L-Dex screen.   PAIN:  Are you having pain? No  SOZO SCREENING: Patient was assessed today using the SOZO machine to determine the lymphedema index score. This was compared to her baseline score. It was determined that she is within the recommended range when compared to her baseline and no further action is needed at this time. She will continue SOZO screenings. These are done every 3 months for 2 years post operatively followed by every 6 months for 2 years, and then annually. Encouraged pt to resume wearing her compression bra 24/7 as able for next few months as  she has just finished radiation.    L-DEX FLOWSHEETS - 09/07/23 1500       L-DEX LYMPHEDEMA SCREENING   Measurement Type Unilateral    L-DEX MEASUREMENT EXTREMITY Upper Extremity    POSITION  Standing    DOMINANT SIDE Right    At Risk Side Left    BASELINE SCORE (UNILATERAL) 0.7    L-DEX SCORE (UNILATERAL) -0.3    VALUE CHANGE (UNILAT) -1            Aden Chelsea [name], PTA 09/07/2023, 3:57 PM

## 2023-12-07 ENCOUNTER — Ambulatory Visit: Attending: Surgery

## 2023-12-07 VITALS — Wt 161.0 lb

## 2023-12-07 DIAGNOSIS — Z483 Aftercare following surgery for neoplasm: Secondary | ICD-10-CM | POA: Insufficient documentation

## 2023-12-07 NOTE — Therapy (Signed)
 OUTPATIENT PHYSICAL THERAPY SOZO SCREENING NOTE   Patient Name: Chelsea [name] MRN: [id number] DOB:Jan 08, 1966, 58 y.o., female Today's Date: 12/07/2023  PCP: Cleotilde Planas, MD REFERRING PROVIDER: Belinda Cough, MD   PT End of Session - 12/07/23 1557     Visit Number 2   # unchanged due to screen only   PT Start Time 1555    PT Stop Time 1559    PT Time Calculation (min) 4 min    Activity Tolerance Patient tolerated treatment well    Behavior During Therapy [facility] for tasks assessed/performed          Past Medical History:  Diagnosis Date   Allergy    Anemia    Depression 2004   History - situational, no meds   Encounter for insertion of Mirena IUD 08/04/2008   Enlarged thyroid     being monitored yearly   History of COVID-19 02/2019   Hx of adenomatous polyp of colon 01/23/2016   Hypertriglyceridemia    Multiple thyroid  nodules    being monitored yearly   Seasonal allergies    Seasonal allergies    SVD (spontaneous vaginal delivery)    x 2   Past Surgical History:  Procedure Laterality Date   APPENDECTOMY  1988   BREAST LUMPECTOMY WITH RADIOACTIVE SEED AND SENTINEL LYMPH NODE BIOPSY Left 03/05/2022   Procedure: LEFT BREAST LUMPECTOMY WITH RADIOACTIVE SEED AND SENTINEL LYMPH NODE BIOPSY;  Surgeon: Belinda Cough, MD;  Location: [facility];  Service: General;  Laterality: Left;  PEC BLOCK   BREAST LUMPECTOMY WITH RADIOACTIVE SEED LOCALIZATION Right 03/05/2022   Procedure: RIGHT BREAST LUMPECTOMY WITH RADIOACTIVE SEED LOCALIZATION;  Surgeon: Belinda Cough, MD;  Location: [facility];  Service: General;  Laterality: Right;   COLONOSCOPY  04/22/2019   COLONOSCOPY  01/04/2016   COLPOSCOPY  1994   ASCUS   DILATION AND CURETTAGE OF UTERUS N/A 06/13/2014   Procedure: DILATATION AND CURETTAGE;  Surgeon: Bobie FORBES Cathlyn JAYSON Nikki, MD;  Location: [facility];  Service: Gynecology;  Laterality: N/A;   HYSTEROSCOPY N/A 06/13/2014   Procedure: HYSTEROSCOPY WITH  NOVASURE ABLATION and removal of malpositioned IUD ;  Surgeon: Bobie FORBES Cathlyn JAYSON Nikki, MD;  Location: [facility];  Service: Gynecology;  Laterality: N/A;   LAPAROSCOPIC TUBAL LIGATION N/A 06/13/2014   Procedure: LAPAROSCOPIC BILATERAL TUBAL LIGATION WITH CAUTERY ;  Surgeon: Bobie FORBES Cathlyn JAYSON Nikki, MD;  Location: [facility];  Service: Gynecology;  Laterality: N/A;   thyroid  biopsy  10/11   hyplerplastic thyroid  nodule- multinodular goiter   TUBAL LIGATION  06-13-14   WISDOM TOOTH EXTRACTION     Patient Active Problem List   Diagnosis Date Noted   Nontoxic single thyroid  nodule 10/29/2022   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 It measures 0.8 cm and is located in the upper outer quadrant. It is ER/PR positive and HER2 negative with a Ki67 of 40%. She has a complex sclerosing lesion on the right side.  bil lumpectomy on 03/05/22 with left SLNB with 1 negative node removed.   PRECAUTIONS: left UE Lymphedema risk, None  SUBJECTIVE: Pt returns for her last 3  month L-Dex screen.   PAIN:  Are you having pain? No  SOZO SCREENING: Patient was assessed today using the SOZO machine to determine the lymphedema index score. This was compared to her baseline score. It was determined that she is within the recommended range when compared to her baseline and no further action is needed at this time. She will continue SOZO screenings. These are done every 3 months for 2 years post operatively followed by every 6 months for 2 years, and then annually. Encouraged pt to resume wearing her compression bra 24/7 as able for next few  months as she has just finished radiation.    L-DEX FLOWSHEETS - 12/07/23 1500       L-DEX LYMPHEDEMA SCREENING   Measurement Type Unilateral    L-DEX MEASUREMENT EXTREMITY Upper Extremity    POSITION  Standing    DOMINANT SIDE Right    At Risk Side Left    BASELINE SCORE (UNILATERAL) 0.7    L-DEX SCORE (UNILATERAL) -2    VALUE CHANGE (UNILAT) -2.7         P: One more 3 month L-Dex screen then transition to 6 months until 02/2026.    [name], PTA 12/07/2023, 3:57 PM

## 2023-12-08 ENCOUNTER — Encounter: Payer: Self-pay | Admitting: Obstetrics and Gynecology

## 2023-12-08 ENCOUNTER — Ambulatory Visit: Payer: BC Managed Care – PPO | Admitting: Obstetrics and Gynecology

## 2023-12-08 ENCOUNTER — Other Ambulatory Visit: Payer: Self-pay

## 2023-12-08 ENCOUNTER — Telehealth: Payer: Self-pay | Admitting: Obstetrics and Gynecology

## 2023-12-08 VITALS — BP 118/84 | HR 71 | Ht 68.0 in | Wt 158.0 lb

## 2023-12-08 DIAGNOSIS — Z01419 Encounter for gynecological examination (general) (routine) without abnormal findings: Secondary | ICD-10-CM | POA: Diagnosis not present

## 2023-12-08 DIAGNOSIS — N393 Stress incontinence (female) (male): Secondary | ICD-10-CM | POA: Diagnosis not present

## 2023-12-08 DIAGNOSIS — Z1331 Encounter for screening for depression: Secondary | ICD-10-CM

## 2023-12-08 NOTE — Progress Notes (Signed)
 58 y.o. G74P2012 Divorced Caucasian female here for annual exam.    No vaginal bleeding.    Leaks urine with cough or sneeze.  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   PCP: Cleotilde Planas, MD   No LMP recorded (exact date). Patient is postmenopausal.           Sexually active: Yes.    The current method of family planning is tubal ligation.    Menopausal hormone therapy:  n/a Exercising: Yes.    Walking  Smoker:  no  OB History  Gravida Para Term Preterm AB Living  3 2 2  1 2   SAB IAB Ectopic Multiple Live Births      2    # Outcome Date GA Lbr Len/2nd Weight Sex Type Anes PTL Lv  3 Term 1994 [redacted]w[redacted]d  8 lb 6 oz (3.799 kg) M Vag-Spont   LIV  2 Term 1990 [redacted]w[redacted]d  7 lb 13 oz (3.544 kg) F Vag-Spont   LIV  1 AB              HEALTH MAINTENANCE: Last 2 paps:  11/26/20 neg, HR HPV neg  History of abnormal Pap or positive HPV:  yes, ASCUS 1994. Colpo and cryotherapy to cervix    Mammogram:   01/31/23 Breast Density Cat B, BIRADS Cat 2 benign  Colonoscopy:  04/22/19 - due march, 2025.   Bone Density:  n/a  Result  n/a   Immunization History  Administered Date(s) Administered   Influenza,inj,Quad PF,6+ Mos 11/18/2018   PFIZER(Purple Top)SARS-COV-2 Vaccination 09/15/2019, 10/26/2019   Tdap 11/03/2016      reports that she has never smoked. She has never used smokeless tobacco. She reports current alcohol use of about 2.0 - 3.0 standard drinks of alcohol per week. She reports that she does not use drugs.  Past Medical History:  Diagnosis Date   Allergy    Anemia    Depression 2004   History - situational, no meds   Encounter for insertion of Mirena IUD 08/04/2008   Enlarged thyroid     being monitored yearly   History of COVID-19 02/2019   Hx of adenomatous polyp of colon 01/23/2016   Hypertriglyceridemia    Multiple thyroid  nodules    being monitored yearly   Seasonal allergies    Seasonal allergies    SVD (spontaneous vaginal delivery)    x 2    Past Surgical  History:  Procedure Laterality Date   APPENDECTOMY  1988   BREAST LUMPECTOMY WITH RADIOACTIVE SEED AND SENTINEL LYMPH NODE BIOPSY Left 03/05/2022   Procedure: LEFT BREAST LUMPECTOMY WITH RADIOACTIVE SEED AND SENTINEL LYMPH NODE BIOPSY;  Surgeon: Belinda Cough, MD;  Location: [facility];  Service: General;  Laterality: Left;  PEC BLOCK   BREAST LUMPECTOMY WITH RADIOACTIVE SEED LOCALIZATION Right 03/05/2022   Procedure: RIGHT BREAST LUMPECTOMY WITH RADIOACTIVE SEED LOCALIZATION;  Surgeon: Belinda Cough, MD;  Location: [facility];  Service: General;  Laterality: Right;   COLONOSCOPY  04/22/2019   COLONOSCOPY  01/04/2016   COLPOSCOPY  1994   ASCUS   DILATION AND CURETTAGE OF UTERUS N/A 06/13/2014   Procedure: DILATATION AND CURETTAGE;  Surgeon: Bobie FORBES Cathlyn JAYSON Nikki, MD;  Location: [facility];  Service: Gynecology;  Laterality: N/A;   HYSTEROSCOPY N/A 06/13/2014   Procedure: HYSTEROSCOPY WITH NOVASURE ABLATION and removal of malpositioned IUD ;  Surgeon: Bobie FORBES Cathlyn JAYSON Nikki, MD;  Location: [facility];  Service: Gynecology;  Laterality: N/A;  LAPAROSCOPIC TUBAL LIGATION N/A 06/13/2014   Procedure: LAPAROSCOPIC BILATERAL TUBAL LIGATION WITH CAUTERY ;  Surgeon: Bobie FORBES Cathlyn JAYSON Nikki, MD;  Location: [facility];  Service: Gynecology;  Laterality: N/A;   thyroid  biopsy  10/11   hyplerplastic thyroid  nodule- multinodular goiter   TUBAL LIGATION  06-13-14   WISDOM TOOTH EXTRACTION      Current Outpatient Medications  Medication Sig Dispense Refill   Ibuprofen  200 MG CAPS Take by mouth.     tamoxifen  (NOLVADEX ) 20 MG tablet TAKE 1 TABLET BY MOUTH EVERY DAY 90 tablet 3   meloxicam (MOBIC) 15 MG tablet Take 15 mg by mouth daily. (Patient not taking: Reported on 12/08/2023)     methocarbamol (ROBAXIN) 500 MG tablet Take 1 tablet by mouth as needed. c (Patient not taking: Reported on 12/08/2023)     naproxen (NAPROSYN) 500 MG tablet Take 500 mg by mouth 2 (two) times daily as  needed. Reported on 02/26/2015 (Patient not taking: Reported on 12/08/2023)  2   No current facility-administered medications for this visit.    ALLERGIES: Elemental sulfur, Codeine, Mupirocin, Percocet [oxycodone -acetaminophen ], and Sulfa antibiotics  Family History  Problem Relation Age of Onset   Hypertension Mother    Hypertension Father    Colon polyps Father    Colon cancer Neg Hx    Rectal cancer Neg Hx    Esophageal cancer Neg Hx    Stomach cancer Neg Hx     Review of Systems  All other systems reviewed and are negative.   PHYSICAL EXAM:  BP 118/84 (BP Location: Left Arm, Patient Position: Sitting)   Pulse 71   Ht 5' 8 (1.727 m)   Wt 158 lb (71.7 kg)   LMP  (Exact Date)   SpO2 99%   BMI 24.02 kg/m     General appearance: alert, cooperative and appears stated age Head: normocephalic, without obvious abnormality, atraumatic Neck: no adenopathy, supple, symmetrical, trachea midline and thyroid  normal to inspection and palpation Lungs: clear to auscultation bilaterally Breasts: normal appearance, no masses or tenderness, No nipple retraction or dimpling, No nipple discharge or bleeding, No axillary adenopathy Heart: regular rate and rhythm Abdomen: soft, non-tender; no masses, no organomegaly Extremities: extremities normal, atraumatic, no cyanosis or edema Skin: skin color, texture, turgor normal. No rashes or lesions Lymph nodes: cervical, supraclavicular, and axillary nodes normal. Neurologic: grossly normal  Pelvic: External genitalia:  no lesions              No abnormal inguinal nodes palpated.              Urethra:  normal appearing urethra with no masses, tenderness or lesions              Bartholins and Skenes: normal                 Vagina: normal appearing vagina with normal color and discharge, no lesions              Cervix: no lesions              Pap taken: no Bimanual Exam:  Uterus:  normal size, contour, position, consistency, mobility,  non-tender              Adnexa: no mass, fullness, tenderness              Rectal exam: yes.  Confirms.              Anus:  normal sphincter tone, no lesions  Chaperone was present for exam:  Kari HERO, CMA  ASSESSMENT: Well woman visit with gynecologic exam. Status post BTL. Left breast cancer.  Status post lumpectomy, XRT, on Tamoxifen .  Status post right breast lumpectomy.  Benign.  Declined genetic testing.  Remote history of cryotherapy to cervix.  Stress urinary incontinence.  Enlarged thyroid  monitored by Dr. Tommas.  Recent TFTs normal.  PHQ-2-9: 0  PLAN: Mammogram screening discussed. Self breast awareness reviewed. Pap and HRV collected:  no.  Due in 2027 Guidelines for Calcium, Vitamin D , regular exercise program including cardiovascular and weight bearing exercise. Medication refills:  NA Referral for [facility]. Labs with PCP.  Follow up:  yearly and prn.

## 2023-12-08 NOTE — Telephone Encounter (Signed)
 Referral placed.

## 2023-12-08 NOTE — Telephone Encounter (Signed)
 Please make a referral to [facility] for stress incontinence.

## 2023-12-08 NOTE — Patient Instructions (Signed)

## 2024-01-06 NOTE — Telephone Encounter (Signed)
 Appt 02/23/24.  Encounter closed.

## 2024-01-12 ENCOUNTER — Telehealth: Payer: Self-pay | Admitting: Hematology and Oncology

## 2024-01-12 NOTE — Telephone Encounter (Signed)
 Left pt a voicemail regarding 03/28/24 appt being rescheduled to 03/31/24.

## 2024-02-18 ENCOUNTER — Ambulatory Visit: Payer: Self-pay | Admitting: Obstetrics and Gynecology

## 2024-02-23 ENCOUNTER — Ambulatory Visit: Admitting: Physical Therapy

## 2024-03-04 ENCOUNTER — Telehealth: Payer: Self-pay | Admitting: Hematology and Oncology

## 2024-03-04 NOTE — Telephone Encounter (Signed)
 I left voicemail for patient regarding 03/31/2024 MD appointment time change from 8:30 am to 10:00 am.

## 2024-03-07 ENCOUNTER — Ambulatory Visit: Attending: Surgery

## 2024-03-28 ENCOUNTER — Ambulatory Visit: Payer: 59 | Admitting: Hematology and Oncology

## 2024-03-31 ENCOUNTER — Inpatient Hospital Stay: Admitting: Hematology and Oncology

## 2024-12-12 ENCOUNTER — Ambulatory Visit: Admitting: Obstetrics and Gynecology
